# Patient Record
Sex: Female | Born: 1935 | Race: White | Hispanic: No | Smoking: Never smoker
Health system: Southern US, Community
[De-identification: ages and names within clinical notes are randomized; demographics above are authoritative.]

## PROBLEM LIST (undated history)

## (undated) DIAGNOSIS — C4492 Squamous cell carcinoma of skin, unspecified: Secondary | ICD-10-CM

## (undated) DIAGNOSIS — C801 Malignant (primary) neoplasm, unspecified: Secondary | ICD-10-CM

## (undated) DIAGNOSIS — H409 Unspecified glaucoma: Secondary | ICD-10-CM

## (undated) DIAGNOSIS — R7989 Other specified abnormal findings of blood chemistry: Secondary | ICD-10-CM

## (undated) DIAGNOSIS — Z9089 Acquired absence of other organs: Secondary | ICD-10-CM

## (undated) DIAGNOSIS — Z9889 Other specified postprocedural states: Secondary | ICD-10-CM

## (undated) DIAGNOSIS — B029 Zoster without complications: Secondary | ICD-10-CM

## (undated) DIAGNOSIS — F32A Depression, unspecified: Secondary | ICD-10-CM

## (undated) DIAGNOSIS — I10 Essential (primary) hypertension: Secondary | ICD-10-CM

## (undated) DIAGNOSIS — I6529 Occlusion and stenosis of unspecified carotid artery: Secondary | ICD-10-CM

## (undated) DIAGNOSIS — F329 Major depressive disorder, single episode, unspecified: Secondary | ICD-10-CM

## (undated) DIAGNOSIS — R112 Nausea with vomiting, unspecified: Secondary | ICD-10-CM

## (undated) DIAGNOSIS — E785 Hyperlipidemia, unspecified: Secondary | ICD-10-CM

## (undated) DIAGNOSIS — F419 Anxiety disorder, unspecified: Secondary | ICD-10-CM

## (undated) DIAGNOSIS — M1990 Unspecified osteoarthritis, unspecified site: Secondary | ICD-10-CM

## (undated) DIAGNOSIS — N6019 Diffuse cystic mastopathy of unspecified breast: Secondary | ICD-10-CM

## (undated) DIAGNOSIS — K5792 Diverticulitis of intestine, part unspecified, without perforation or abscess without bleeding: Secondary | ICD-10-CM

## (undated) DIAGNOSIS — R945 Abnormal results of liver function studies: Secondary | ICD-10-CM

## (undated) HISTORY — PX: CAROTID ENDARTERECTOMY: SUR193

## (undated) HISTORY — DX: Abnormal results of liver function studies: R94.5

## (undated) HISTORY — DX: Depression, unspecified: F32.A

## (undated) HISTORY — DX: Unspecified glaucoma: H40.9

## (undated) HISTORY — DX: Zoster without complications: B02.9

## (undated) HISTORY — DX: Acquired absence of other organs: Z90.89

## (undated) HISTORY — DX: Diverticulitis of intestine, part unspecified, without perforation or abscess without bleeding: K57.92

## (undated) HISTORY — DX: Hyperlipidemia, unspecified: E78.5

## (undated) HISTORY — PX: EYE SURGERY: SHX253

## (undated) HISTORY — DX: Essential (primary) hypertension: I10

## (undated) HISTORY — DX: Malignant (primary) neoplasm, unspecified: C80.1

## (undated) HISTORY — DX: Anxiety disorder, unspecified: F41.9

## (undated) HISTORY — DX: Diffuse cystic mastopathy of unspecified breast: N60.19

## (undated) HISTORY — DX: Major depressive disorder, single episode, unspecified: F32.9

## (undated) HISTORY — DX: Other specified abnormal findings of blood chemistry: R79.89

## (undated) HISTORY — PX: TONSILLECTOMY AND ADENOIDECTOMY: SUR1326

## (undated) HISTORY — DX: Occlusion and stenosis of unspecified carotid artery: I65.29

## (undated) HISTORY — PX: ESOPHAGOGASTRODUODENOSCOPY: SHX1529

## (undated) HISTORY — DX: Unspecified osteoarthritis, unspecified site: M19.90

## (undated) HISTORY — PX: TOE SURGERY: SHX1073

---
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
## 1970-12-14 DIAGNOSIS — C801 Malignant (primary) neoplasm, unspecified: Secondary | ICD-10-CM

## 1970-12-14 HISTORY — DX: Malignant (primary) neoplasm, unspecified: C80.1

## 1973-12-14 DIAGNOSIS — Z9089 Acquired absence of other organs: Secondary | ICD-10-CM

## 1973-12-14 HISTORY — DX: Acquired absence of other organs: Z90.89

## 1979-08-15 HISTORY — PX: ABDOMINAL HYSTERECTOMY: SHX81

## 2003-01-01 ENCOUNTER — Encounter: Payer: Self-pay | Admitting: Neurosurgery

## 2003-01-01 ENCOUNTER — Encounter: Admission: RE | Admit: 2003-01-01 | Discharge: 2003-01-01 | Payer: Self-pay | Admitting: Neurosurgery

## 2003-01-15 ENCOUNTER — Encounter: Admission: RE | Admit: 2003-01-15 | Discharge: 2003-01-15 | Payer: Self-pay | Admitting: Neurosurgery

## 2003-01-15 ENCOUNTER — Encounter: Payer: Self-pay | Admitting: Neurosurgery

## 2003-01-29 ENCOUNTER — Encounter: Admission: RE | Admit: 2003-01-29 | Discharge: 2003-01-29 | Payer: Self-pay | Admitting: Neurosurgery

## 2003-01-29 ENCOUNTER — Encounter: Payer: Self-pay | Admitting: Neurosurgery

## 2003-02-14 ENCOUNTER — Encounter: Payer: Self-pay | Admitting: Family Medicine

## 2003-02-14 ENCOUNTER — Encounter: Admission: RE | Admit: 2003-02-14 | Discharge: 2003-02-14 | Payer: Self-pay | Admitting: Family Medicine

## 2003-02-19 ENCOUNTER — Encounter: Payer: Self-pay | Admitting: Family Medicine

## 2003-02-19 ENCOUNTER — Encounter: Admission: RE | Admit: 2003-02-19 | Discharge: 2003-02-19 | Payer: Self-pay | Admitting: Family Medicine

## 2003-03-08 ENCOUNTER — Encounter: Payer: Self-pay | Admitting: Family Medicine

## 2003-03-08 ENCOUNTER — Encounter: Admission: RE | Admit: 2003-03-08 | Discharge: 2003-03-08 | Payer: Self-pay | Admitting: Family Medicine

## 2006-01-12 DIAGNOSIS — C4492 Squamous cell carcinoma of skin, unspecified: Secondary | ICD-10-CM

## 2006-01-12 HISTORY — DX: Squamous cell carcinoma of skin, unspecified: C44.92

## 2006-12-14 HISTORY — PX: COLONOSCOPY: SHX174

## 2007-02-16 ENCOUNTER — Encounter: Admission: RE | Admit: 2007-02-16 | Discharge: 2007-02-16 | Payer: Self-pay | Admitting: *Deleted

## 2007-03-02 ENCOUNTER — Encounter: Admission: RE | Admit: 2007-03-02 | Discharge: 2007-03-02 | Payer: Self-pay | Admitting: Family Medicine

## 2008-12-14 DIAGNOSIS — B029 Zoster without complications: Secondary | ICD-10-CM

## 2008-12-14 HISTORY — DX: Zoster without complications: B02.9

## 2009-01-10 ENCOUNTER — Emergency Department (HOSPITAL_COMMUNITY): Admission: EM | Admit: 2009-01-10 | Discharge: 2009-01-10 | Payer: Self-pay | Admitting: Emergency Medicine

## 2009-02-05 ENCOUNTER — Ambulatory Visit: Admission: RE | Admit: 2009-02-05 | Discharge: 2009-02-05 | Payer: Self-pay | Admitting: *Deleted

## 2009-07-11 ENCOUNTER — Encounter: Admission: RE | Admit: 2009-07-11 | Discharge: 2009-07-11 | Payer: Self-pay | Admitting: Neurosurgery

## 2010-08-02 ENCOUNTER — Emergency Department (HOSPITAL_COMMUNITY): Admission: EM | Admit: 2010-08-02 | Discharge: 2010-08-02 | Payer: Self-pay | Admitting: Emergency Medicine

## 2011-03-30 LAB — COMPREHENSIVE METABOLIC PANEL
ALT: 65 U/L — ABNORMAL HIGH (ref 0–35)
Creatinine, Ser: 0.97 mg/dL (ref 0.4–1.2)
Potassium: 3.7 mEq/L (ref 3.5–5.1)
Total Protein: 6.7 g/dL (ref 6.0–8.3)

## 2011-03-30 LAB — DIFFERENTIAL
Eosinophils Absolute: 0.3 10*3/uL (ref 0.0–0.7)
Eosinophils Relative: 4 % (ref 0–5)
Lymphocytes Relative: 25 % (ref 12–46)
Lymphs Abs: 1.8 10*3/uL (ref 0.7–4.0)
Monocytes Absolute: 0.6 10*3/uL (ref 0.1–1.0)
Neutro Abs: 4.4 10*3/uL (ref 1.7–7.7)
Neutrophils Relative %: 61 % (ref 43–77)

## 2011-03-30 LAB — CBC
Hemoglobin: 15.3 g/dL — ABNORMAL HIGH (ref 12.0–15.0)
MCHC: 33.5 g/dL (ref 30.0–36.0)
WBC: 7.2 10*3/uL (ref 4.0–10.5)

## 2011-03-30 LAB — D-DIMER, QUANTITATIVE: D-Dimer, Quant: 0.68 ug/mL-FEU — ABNORMAL HIGH (ref 0.00–0.48)

## 2011-03-30 LAB — BRAIN NATRIURETIC PEPTIDE: Pro B Natriuretic peptide (BNP): 168 pg/mL — ABNORMAL HIGH (ref 0.0–100.0)

## 2011-03-30 LAB — POCT CARDIAC MARKERS: CKMB, poc: 3.3 ng/mL (ref 1.0–8.0)

## 2012-11-01 ENCOUNTER — Other Ambulatory Visit: Payer: Self-pay | Admitting: Internal Medicine

## 2012-11-01 DIAGNOSIS — R0989 Other specified symptoms and signs involving the circulatory and respiratory systems: Secondary | ICD-10-CM

## 2012-11-07 ENCOUNTER — Ambulatory Visit
Admission: RE | Admit: 2012-11-07 | Discharge: 2012-11-07 | Disposition: A | Discharge status: Home / Self Care | Payer: Medicare Other | Source: Ambulatory Visit | Attending: Internal Medicine | Admitting: Internal Medicine

## 2012-11-07 DIAGNOSIS — R0989 Other specified symptoms and signs involving the circulatory and respiratory systems: Secondary | ICD-10-CM

## 2012-11-16 ENCOUNTER — Other Ambulatory Visit: Payer: Self-pay | Admitting: *Deleted

## 2012-11-16 DIAGNOSIS — I6529 Occlusion and stenosis of unspecified carotid artery: Secondary | ICD-10-CM

## 2012-11-25 ENCOUNTER — Encounter: Payer: Self-pay | Admitting: Surgery

## 2012-11-30 ENCOUNTER — Other Ambulatory Visit: Payer: Self-pay | Admitting: *Deleted

## 2012-11-30 DIAGNOSIS — I6529 Occlusion and stenosis of unspecified carotid artery: Secondary | ICD-10-CM

## 2012-12-02 ENCOUNTER — Encounter: Payer: Self-pay | Admitting: Surgery

## 2012-12-05 ENCOUNTER — Ambulatory Visit (INDEPENDENT_AMBULATORY_CARE_PROVIDER_SITE_OTHER): Payer: Medicare Other | Admitting: Surgery

## 2012-12-05 ENCOUNTER — Other Ambulatory Visit (INDEPENDENT_AMBULATORY_CARE_PROVIDER_SITE_OTHER): Payer: Medicare Other | Admitting: *Deleted

## 2012-12-05 ENCOUNTER — Encounter: Payer: Self-pay | Admitting: Surgery

## 2012-12-05 VITALS — BP 140/71 | HR 89 | Resp 18 | Ht 63.0 in | Wt 155.0 lb

## 2012-12-05 DIAGNOSIS — I6529 Occlusion and stenosis of unspecified carotid artery: Secondary | ICD-10-CM

## 2012-12-05 NOTE — Progress Notes (Signed)
Vascular and Vein Specialist of [facility]   Patient name: Michaela Flynn MRN: [id number] DOB: [birth year] Sex: female   Referred by: Dr. Selena Batten  Reason for referral:  Chief Complaint  Patient presents with  . New Evaluation    right carotid stenosis    HISTORY OF PRESENT ILLNESS: This is a very pleasant 78 year old female that is referred today for carotid stenosis. It is difficult to ascertain whether or not she is symptomatic. She has had a fall within the past 3 months where she missed the bed when sitting down. She has had some episodes of slurred speech. She has seen an ophthalmologist that thought she had opted nerve atherosclerotic changes. She has been managed with an aspirin. She denies weakness in either extremity.  She does not have classic symptoms of amaurosis fugax.  The patient does have hypercholesterolemia but is not on a statin because of elevated liver enzymes in the past. She is medically managed for her hypertension. She denies any significant cardiac history. She has had a echo in the past to evaluate for any pericardial effusion. This was negative.  Past Medical History  Diagnosis Date  . Hyperlipidemia   . Hypertension   . Anxiety and depression   . Glaucoma   . Abnormal LFTs     secondary to cholesterol  medication  . Fibrocystic breast disease   . Diverticulitis   . Shingles 2010  . S/P tonsillectomy and adenoidectomy 1975  . Cancer 1972    Nonmelanoma resection  . Arthritis     Lower back," Osteo"  . Carotid artery occlusion     Bruit    Past Surgical History  Procedure Date  . Cholecystectomy 1956  . Eye surgery     Cataract  . Appendectomy 1956  . Colonoscopy 2008    [facility] [place]  . Esophagogastroduodenoscopy     ?  Zenker's per the patient  . Toe surgery     Onychomycosis    History   Social History  . Marital Status: Widowed    Spouse Name: N/A    Number of Children: N/A  . Years of Education: N/A   Occupational History  .  Not on file.   Social History Main Topics  . Smoking status: Never Smoker   . Smokeless tobacco: Never Used  . Alcohol Use: No  . Drug Use: No  . Sexually Active: Not on file   Other Topics Concern  . Not on file   Social History Narrative  . No narrative on file    Family History  Problem Relation Age of Onset  . Stroke Father   . Heart attack Father     Allergies as of 12/05/2012  . (No Known Allergies)    Current Outpatient Prescriptions on File Prior to Visit  Medication Sig Dispense Refill  . aspirin 325 MG tablet Take 325 mg by mouth daily.      . benazepril-hydrochlorthiazide (LOTENSIN HCT) 20-12.5 MG per tablet Take 1 tablet by mouth daily.      . clonazePAM (KLONOPIN) 0.5 MG tablet Take 0.5 mg by mouth 2 (two) times daily as needed.      Marland Kitchen ESCITALOPRAM OXALATE PO Take 10 mg by mouth daily.      . metoprolol succinate (TOPROL-XL) 25 MG 24 hr tablet Take 25 mg by mouth daily.      Bertram Gala Glycol-Propyl Glycol (SYSTANE PRESERVATIVE FREE) 0.4-0.3 % SOLN Apply to eye.      . traMADol (ULTRAM) 50  MG tablet Take 50 mg by mouth every 6 (six) hours as needed.      . travoprost, benzalkonium, (TRAVATAN) 0.004 % ophthalmic solution 1 drop at bedtime.      Marland Kitchen zolpidem (AMBIEN) 5 MG tablet Take 5 mg by mouth at bedtime as needed.         REVIEW OF SYSTEMS: Cardiovascular: No chest pain, chest pressure, palpitations, orthopnea, or dyspnea on exertion. No claudication or rest pain,  No history of DVT or phlebitis. Pulmonary: No productive cough, asthma or wheezing. Neurologic: No weakness, paresthesias, aphasia, or amaurosis. No dizziness. Poor hearing Hematologic: No bleeding problems or clotting disorders. Musculoskeletal: No joint pain or joint swelling. Gastrointestinal: No blood in stool or hematemesis Genitourinary: No dysuria or hematuria. Psychiatric:: No history of major depression. Integumentary: No rashes or ulcers. Constitutional: No fever or  chills.  PHYSICAL EXAMINATION: General: The patient appears their stated age.  Vital signs are BP 140/71  Pulse 89  Resp 18  Ht 5\' 3"  (1.6 m)  Wt 155 lb (70.308 kg)  BMI 27.46 kg/m2 HEENT:  No gross abnormalities Pulmonary: Respirations are non-labored Abdomen: Soft and non-tender  Musculoskeletal: There are no major deformities.   Neurologic: No focal weakness or paresthesias are detected, Skin: There are no ulcer or rashes noted. Psychiatric: The patient has normal affect. Cardiovascular: There is a regular rate and rhythm without significant murmur appreciated. I could not hear a carotid bruit today pedal pulses were not palpable. There was a fair amount of edema in both ankles.  Diagnostic Studies: Outside carotid imaging reveals greater than 70% right carotid stenosis and less than 50% left carotid stenosis. The imaging was repeated in our office today which shows 80-99% stenosis on the right. In that diastolic velocities were to 35. The bifurcation was noted to be mid to high  Outside Studies/Documentation Historical records were reviewed.  They showed right carotid stenosis  Medication Changes: None  Assessment:  Possible symptomatic right carotid stenosis Plan: It is difficult to tell by the patient's history whether or not she has had symptomatology from her right carotid stenosis. She is neurologically intact today. Based on the degree of stenosis we have decided to proceed with intervention. Because of her age, I am reluctant to proceed with carotid stenting. We discussed the risks and benefits of carotid endarterectomy, which include the risk of stroke, the risk of nerve injury, and the risk of cardiopulmonary complications. Prior to proceeding I am going to obtain a Myoview to evaluate her heart. 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 She will bring a living Will with her however in our discussion, she is accepting of blood substitutes as well as Cell Saver blood.  She states that she would rather die than receive a blood transfusion. I have scheduled her for a right carotid endarterectomy on Friday, January 10     V. Charlena Cross, M.D. V[facility] and Vein Specialists of [place] Office: [phone] Pager:  [phone]

## 2012-12-06 ENCOUNTER — Other Ambulatory Visit: Payer: Self-pay | Admitting: *Deleted

## 2012-12-06 ENCOUNTER — Other Ambulatory Visit (HOSPITAL_COMMUNITY): Payer: Self-pay | Admitting: Surgery

## 2012-12-06 ENCOUNTER — Encounter: Payer: Self-pay | Admitting: *Deleted

## 2012-12-06 ENCOUNTER — Telehealth: Payer: Self-pay | Admitting: Surgery

## 2012-12-06 DIAGNOSIS — I6529 Occlusion and stenosis of unspecified carotid artery: Secondary | ICD-10-CM

## 2012-12-06 NOTE — Telephone Encounter (Signed)
Patient is scheduled for Carotid Endarterectomy for January 10th, according to blue dc 12/05/12- pt will need Myoview @ [facility]. Pts daughter Michaela Flynn asked that we schedule this on a Monday due to her schedule.  According to [facility], Stress Tests are not performed on Mondays.  Myoview is scheduled for Thursday Jan 2nd 2013 @ 1:45pm.  I spoke with Michaela Flynn to notify, and she was concerned that the appointment was not on a Monday and stated she would "have to talk to her brother." I advised that if the appointment needed to be rescheduled, she could call [facility] directly to reschedule in order to accommodate her schedule. I also explained that we would need to have the results of the Myoview a day prior to her 12/24/11 surgery. dpm

## 2012-12-12 ENCOUNTER — Other Ambulatory Visit: Payer: Self-pay

## 2012-12-15 ENCOUNTER — Encounter (HOSPITAL_COMMUNITY): Payer: Self-pay | Admitting: Respiratory Therapy

## 2012-12-15 ENCOUNTER — Ambulatory Visit (HOSPITAL_COMMUNITY)
Admission: RE | Admit: 2012-12-15 | Discharge: 2012-12-15 | Disposition: A | Discharge status: Home / Self Care | Payer: Medicare Other | Source: Ambulatory Visit | Attending: Cardiovascular Disease | Admitting: Cardiovascular Disease

## 2012-12-15 DIAGNOSIS — I6529 Occlusion and stenosis of unspecified carotid artery: Secondary | ICD-10-CM | POA: Insufficient documentation

## 2012-12-15 MED ORDER — REGADENOSON 0.4 MG/5ML IV SOLN
0.4000 mg | Freq: Once | INTRAVENOUS | Status: AC
  Administered 2012-12-15: 0.4 mg via INTRAVENOUS

## 2012-12-15 MED ORDER — AMINOPHYLLINE 25 MG/ML IV SOLN
75.0000 mg | Freq: Once | INTRAVENOUS | Status: AC
  Administered 2012-12-15: 75 mg via INTRAVENOUS

## 2012-12-15 MED ORDER — TECHNETIUM TC 99M SESTAMIBI GENERIC - CARDIOLITE
30.8000 | Freq: Once | INTRAVENOUS | Status: AC | PRN
  Administered 2012-12-15: 30.8 via INTRAVENOUS

## 2012-12-15 MED ORDER — TECHNETIUM TC 99M SESTAMIBI GENERIC - CARDIOLITE
11.0000 | Freq: Once | INTRAVENOUS | Status: AC | PRN
  Administered 2012-12-15: 11 via INTRAVENOUS

## 2012-12-15 NOTE — Procedures (Addendum)
[facility] [address] [place] [address] [phone]  Cardiology Nuclear Med Study  Michaela Flynn is a 79 y.o. female     MRN : [id number]     DOB: 1925/05/22  Procedure Date: 12/15/2012  Nuclear Med Background Indication for Stress Test:  Surgical Clearance History:  no prior cardiac history Cardiac Risk Factors: Family History - CAD, Hypertension, Lipids and Overweight  Symptoms:  Dizziness   Nuclear Pre-Procedure Caffeine/Decaff Intake:  1:00am NPO After: 11:00am   IV Site: R Antecubital  IV 0.9% NS with Angio Cath:  22g  Chest Size (in):  n/a IV Started by: Koren Shiver, CNMT  Height: 5\' 3"  (1.6 m)  Cup Size: C  BMI:  Body mass index is 27.46 kg/(m^2). Weight:  155 lb (70.308 kg)   Tech Comments:  n/a    Nuclear Med Study 1 or 2 day study: 1 day  Stress Test Type:  Lexiscan  Order Authorizing Provider:  Janae Bridgeman, MD   Resting Radionuclide: Technetium 78m Sestamibi  Resting Radionuclide Dose: 11.0 mCi   Stress Radionuclide:  Technetium 74m Sestamibi  Stress Radionuclide Dose: 30.8 mCi           Stress Protocol Rest HR: 54 Stress HR: 74  Rest BP: 168/79 Stress BP: 173/72  Exercise Time (min): n/a METS: n/a          Dose of Adenosine (mg):  n/a Dose of Lexiscan: 0.4 mg  Dose of Atropine (mg): n/a Dose of Dobutamine: n/a mcg/kg/min (at max HR)  Stress Test Technologist: Ernestene Mention, CCT Nuclear Technologist: Gonzella Lex, CNMT   Rest Procedure:  Myocardial perfusion imaging was performed at rest 45 minutes following the intravenous administration of Technetium 53m Sestamibi. Stress Procedure:  The patient received IV Lexiscan 0.4 mg over 15-seconds.  Technetium 62m Sestamibi injected at 30-seconds. Patient was given 75 mg of aminophylline through her IV due to her shortness of breath. Symptoms were resolved. There were no significant changes with Lexiscan.  Quantitative spect images were obtained  after a 45 minute delay.  Transient Ischemic Dilatation (Normal <1.22): 1.10 Lung/Heart Ratio (Normal <0.45):  0.32 QGS EDV:  45 ml QGS ESV:  8 ml LV Ejection Fraction: 82%  Signed by      Rest ECG: NSR with poor R wave progression and T wave inversion in avL.  Stress ECG: No significant change from baseline ECG  QPS Raw Data Images:  Normal; no motion artifact; normal heart/lung ratio. Stress Images:  Normal homogeneous uptake in all areas of the myocardium. Rest Images:  There is mild apical thinning with normal uptake in other regions. Subtraction (SDS):  No evidence of ischemia.  Impression Exercise Capacity:  Lexiscan with no exercise. BP Response:  Normal blood pressure response. Clinical Symptoms:  No significant symptoms noted. ECG Impression:  No significant ST segment change suggestive of ischemia. Comparison with Prior Nuclear Study: No images to compare  Overall Impression:  Normal stress nuclear study. Low risk stress nuclear study.  LV Wall Motion:  NL LV Function; NL Wall Motion   Lennette Bihari, MD  12/15/2012 5:06 PM

## 2012-12-20 ENCOUNTER — Encounter (HOSPITAL_COMMUNITY): Payer: Self-pay

## 2012-12-20 ENCOUNTER — Encounter (HOSPITAL_COMMUNITY)
Admission: RE | Admit: 2012-12-20 | Discharge: 2012-12-20 | Disposition: A | Discharge status: Home / Self Care | Payer: Medicare Other | Source: Ambulatory Visit | Attending: Surgery | Admitting: Surgery

## 2012-12-20 ENCOUNTER — Encounter (HOSPITAL_COMMUNITY)
Admission: RE | Admit: 2012-12-20 | Discharge: 2012-12-20 | Disposition: A | Discharge status: Home / Self Care | Payer: Medicare Other | Source: Ambulatory Visit | Attending: Anesthesiology | Admitting: Anesthesiology

## 2012-12-20 HISTORY — DX: Nausea with vomiting, unspecified: R11.2

## 2012-12-20 HISTORY — DX: Other specified postprocedural states: Z98.890

## 2012-12-20 LAB — COMPREHENSIVE METABOLIC PANEL
ALT: 13 U/L (ref 0–35)
AST: 17 U/L (ref 0–37)
CO2: 29 mEq/L (ref 19–32)
Chloride: 101 mEq/L (ref 96–112)
Creatinine, Ser: 0.97 mg/dL (ref 0.50–1.10)
GFR calc non Af Amer: 52 mL/min — ABNORMAL LOW (ref >90)
Glucose, Bld: 99 mg/dL (ref 70–99)
Sodium: 140 mEq/L (ref 135–145)
Total Bilirubin: 0.5 mg/dL (ref 0.3–1.2)

## 2012-12-20 LAB — CBC
Hemoglobin: 16.1 g/dL — ABNORMAL HIGH (ref 12.0–15.0)
MCV: 88.5 fL (ref 78.0–100.0)
Platelets: 232 10*3/uL (ref 150–400)
RBC: 5.22 MIL/uL — ABNORMAL HIGH (ref 3.87–5.11)
WBC: 7.6 10*3/uL (ref 4.0–10.5)

## 2012-12-20 LAB — URINE MICROSCOPIC-ADD ON

## 2012-12-20 LAB — PROTIME-INR: Prothrombin Time: 12.8 seconds (ref 11.6–15.2)

## 2012-12-20 LAB — URINALYSIS, ROUTINE W REFLEX MICROSCOPIC
Bilirubin Urine: NEGATIVE
Protein, ur: NEGATIVE mg/dL
Urobilinogen, UA: 0.2 mg/dL (ref 0.0–1.0)

## 2012-12-20 NOTE — Pre-Procedure Instructions (Signed)
20 Michaela Flynn  12/20/2012   Your procedure is scheduled on:  January 10  Report to [facility] at 07:30 AM.  Call this number if you have problems the morning of surgery: [phone]   Remember:   Do not eat food or drink:After Midnight.  Take these medicines the morning of surgery with A SIP OF WATER: Klonopin, Lexapro, Metoprolol, Tramadol (if needed), eye drops,   Do not wear jewelry, make-up or nail polish.  Do not wear lotions, powders, or perfumes. You may wear deodorant.  Do not shave 48 hours prior to surgery. Men may shave face and neck.  Do not bring valuables to the hospital.  Contacts, dentures or bridgework may not be worn into surgery.  Leave suitcase in the car. After surgery it may be brought to your room.  For patients admitted to the hospital, checkout time is 11:00 AM the day of discharge.   Special Instructions: Shower using CHG 2 nights before surgery and the night before surgery.  If you shower the day of surgery use CHG.  Use special wash - you have one bottle of CHG for all showers.  You should use approximately 1/3 of the bottle for each shower.   Please read over the following fact sheets that you were given: Pain Booklet, Coughing and Deep Breathing and Surgical Site Infection Prevention

## 2012-12-20 NOTE — Progress Notes (Signed)
Will send chart to anesthesia for review of EKG. Patient will sign blood product refusal DOS after she determines if she can accept albumin.

## 2012-12-21 NOTE — Consult Note (Signed)
Anesthesia Chart Review:  Patient is a 79 year old female scheduled for right CEA by Dr. Myra Gianotti on 12/23/12.  History includes non-smoker, post-operative N/V, HTN, HLD, glaucoma, anxiety, depression, diverticulitis, skin cancer. PCP is Dr. Pearson Grippe. She is a Scientist, product/process development.  She has refused RBCs, but will discuss consideration of other blood products with her surgeon on the day of surgery.  Carotid duplex on 12/05/12 showed 80-99% stenosis on the right. In that diastolic velocities were to 35. The bifurcation was noted to be mid to high.  Less than 50% on the left.  EKG on showed SB, LAD, minimal voltage criteria for LVH, septal infarct (age undetermined).  Nuclear stress test on 12/15/12 showed: Normal stress nuclear study. Low risk stress nuclear study.  LV Wall Motion: NL LV Function; NL Wall Motion.  EF 82%.  CXR on 12/20/12 showed no acute cardiopulmonary abnormality.  Preoperative labs noted.  H/H 16.1/46.2.  LFTs WNL.  Urine culture showed > 100,000 E. Coli.  Results routed to Dr. Myra Gianotti and [facility] nurses Darel Hong and Okey Regal.  Defer treatment recommendations for UTI to Dr. Myra Gianotti.  Anticipate she can proceed from an anesthesia standpoint.  Shonna Chock, PA-C 12/21/12 1231

## 2012-12-22 ENCOUNTER — Other Ambulatory Visit: Payer: Self-pay

## 2012-12-22 LAB — URINE CULTURE

## 2012-12-22 MED ORDER — DEXTROSE 5 % IV SOLN
1.5000 g | INTRAVENOUS | Status: AC
  Administered 2012-12-23: 1.5 g via INTRAVENOUS
  Filled 2012-12-22: qty 1.5

## 2012-12-23 ENCOUNTER — Encounter (HOSPITAL_COMMUNITY): Payer: Self-pay | Admitting: Vascular Surgery

## 2012-12-23 ENCOUNTER — Inpatient Hospital Stay (HOSPITAL_COMMUNITY)
Admission: RE | Admit: 2012-12-23 | Discharge: 2012-12-24 | DRG: 038 | Disposition: A | Discharge status: Home / Self Care | Payer: Medicare Other | Source: Ambulatory Visit | Attending: Surgery | Admitting: Surgery

## 2012-12-23 ENCOUNTER — Telehealth: Payer: Self-pay | Admitting: Surgery

## 2012-12-23 ENCOUNTER — Encounter (HOSPITAL_COMMUNITY)
Admission: RE | Disposition: A | Discharge status: Home / Self Care | Payer: Self-pay | Source: Ambulatory Visit | Attending: Surgery

## 2012-12-23 ENCOUNTER — Ambulatory Visit (HOSPITAL_COMMUNITY): Payer: Medicare Other | Admitting: Vascular Surgery

## 2012-12-23 DIAGNOSIS — I10 Essential (primary) hypertension: Secondary | ICD-10-CM | POA: Diagnosis present

## 2012-12-23 DIAGNOSIS — Z7982 Long term (current) use of aspirin: Secondary | ICD-10-CM

## 2012-12-23 DIAGNOSIS — I6529 Occlusion and stenosis of unspecified carotid artery: Principal | ICD-10-CM | POA: Diagnosis present

## 2012-12-23 DIAGNOSIS — F3289 Other specified depressive episodes: Secondary | ICD-10-CM | POA: Diagnosis present

## 2012-12-23 DIAGNOSIS — Z01818 Encounter for other preprocedural examination: Secondary | ICD-10-CM

## 2012-12-23 DIAGNOSIS — Z01812 Encounter for preprocedural laboratory examination: Secondary | ICD-10-CM

## 2012-12-23 DIAGNOSIS — Z01810 Encounter for preprocedural cardiovascular examination: Secondary | ICD-10-CM

## 2012-12-23 DIAGNOSIS — F411 Generalized anxiety disorder: Secondary | ICD-10-CM | POA: Diagnosis present

## 2012-12-23 DIAGNOSIS — H409 Unspecified glaucoma: Secondary | ICD-10-CM | POA: Diagnosis present

## 2012-12-23 DIAGNOSIS — N390 Urinary tract infection, site not specified: Secondary | ICD-10-CM | POA: Diagnosis present

## 2012-12-23 DIAGNOSIS — F329 Major depressive disorder, single episode, unspecified: Secondary | ICD-10-CM | POA: Diagnosis present

## 2012-12-23 DIAGNOSIS — E785 Hyperlipidemia, unspecified: Secondary | ICD-10-CM | POA: Diagnosis present

## 2012-12-23 DIAGNOSIS — Z79899 Other long term (current) drug therapy: Secondary | ICD-10-CM

## 2012-12-23 HISTORY — PX: PATCH ANGIOPLASTY: SHX6230

## 2012-12-23 HISTORY — PX: ENDARTERECTOMY: SHX5162

## 2012-12-23 LAB — URINE MICROSCOPIC-ADD ON

## 2012-12-23 LAB — URINALYSIS, ROUTINE W REFLEX MICROSCOPIC
Glucose, UA: NEGATIVE mg/dL
Ketones, ur: 15 mg/dL — AB
Specific Gravity, Urine: 1.027 (ref 1.005–1.030)
pH: 5 (ref 5.0–8.0)

## 2012-12-23 SURGERY — ENDARTERECTOMY, CAROTID
Anesthesia: General | Site: Neck | Laterality: Right | Wound class: Clean

## 2012-12-23 MED ORDER — ACETAMINOPHEN 650 MG RE SUPP: 325.0000 mg | RECTAL | Status: DC | PRN

## 2012-12-23 MED ORDER — LIDOCAINE HCL (PF) 1 % IJ SOLN
INTRAMUSCULAR | Status: AC
  Filled 2012-12-23: qty 30

## 2012-12-23 MED ORDER — MAGNESIUM SULFATE 40 MG/ML IJ SOLN
2.0000 g | Freq: Once | INTRAMUSCULAR | Status: AC | PRN
  Filled 2012-12-23: qty 50

## 2012-12-23 MED ORDER — CIPROFLOXACIN IN D5W 400 MG/200ML IV SOLN
400.0000 mg | Freq: Two times a day (BID) | INTRAVENOUS | Status: DC
  Filled 2012-12-23: qty 200

## 2012-12-23 MED ORDER — FENTANYL CITRATE 0.05 MG/ML IJ SOLN
INTRAMUSCULAR | Status: AC
  Filled 2012-12-23: qty 2

## 2012-12-23 MED ORDER — SODIUM CHLORIDE 0.9 % IV SOLN
INTRAVENOUS | Status: DC
  Administered 2012-12-23 (×2): via INTRAVENOUS

## 2012-12-23 MED ORDER — SODIUM CHLORIDE 0.9 % IV SOLN: 500.0000 mL | Freq: Once | INTRAVENOUS | Status: AC | PRN

## 2012-12-23 MED ORDER — LIDOCAINE HCL (CARDIAC) 20 MG/ML IV SOLN
INTRAVENOUS | Status: DC | PRN
  Administered 2012-12-23: 20 mg via INTRAVENOUS

## 2012-12-23 MED ORDER — NEOSTIGMINE METHYLSULFATE 1 MG/ML IJ SOLN
INTRAMUSCULAR | Status: DC | PRN
  Administered 2012-12-23: 3 mg via INTRAVENOUS

## 2012-12-23 MED ORDER — TRAVOPROST 0.004 % OP SOLN
1.0000 [drp] | Freq: Every day | OPHTHALMIC | Status: DC
  Administered 2012-12-23: 1 [drp] via OPHTHALMIC
  Filled 2012-12-23 (×4): qty 0.1

## 2012-12-23 MED ORDER — CIPROFLOXACIN HCL 500 MG PO TABS: 500.0000 mg | ORAL_TABLET | Freq: Two times a day (BID) | ORAL | Status: DC

## 2012-12-23 MED ORDER — ESCITALOPRAM OXALATE 5 MG PO TABS
5.0000 mg | ORAL_TABLET | Freq: Every day | ORAL | Status: DC
  Administered 2012-12-23 – 2012-12-24 (×2): 5 mg via ORAL
  Filled 2012-12-23 (×2): qty 1

## 2012-12-23 MED ORDER — MIDAZOLAM HCL 2 MG/2ML IJ SOLN: 0.5000 mg | Freq: Once | INTRAMUSCULAR | Status: DC | PRN

## 2012-12-23 MED ORDER — ONDANSETRON HCL 4 MG/2ML IJ SOLN: 4.0000 mg | Freq: Four times a day (QID) | INTRAMUSCULAR | Status: DC | PRN

## 2012-12-23 MED ORDER — CEFUROXIME SODIUM 1.5 G IJ SOLR
1.5000 g | Freq: Two times a day (BID) | INTRAMUSCULAR | Status: AC
  Administered 2012-12-23 – 2012-12-24 (×2): 1.5 g via INTRAVENOUS
  Filled 2012-12-23 (×2): qty 1.5

## 2012-12-23 MED ORDER — SODIUM CHLORIDE 0.9 % IR SOLN
Status: DC | PRN
  Administered 2012-12-23: 11:00:00

## 2012-12-23 MED ORDER — GUAIFENESIN-DM 100-10 MG/5ML PO SYRP: 15.0000 mL | ORAL_SOLUTION | ORAL | Status: DC | PRN

## 2012-12-23 MED ORDER — DOPAMINE-DEXTROSE 3.2-5 MG/ML-% IV SOLN: 3.0000 ug/kg/min | INTRAVENOUS | Status: DC

## 2012-12-23 MED ORDER — POTASSIUM CHLORIDE CRYS ER 20 MEQ PO TBCR: 20.0000 meq | EXTENDED_RELEASE_TABLET | Freq: Once | ORAL | Status: AC | PRN

## 2012-12-23 MED ORDER — POLYVINYL ALCOHOL 1.4 % OP SOLN
1.0000 [drp] | Freq: Every day | OPHTHALMIC | Status: DC | PRN
  Filled 2012-12-23: qty 15

## 2012-12-23 MED ORDER — TRAMADOL HCL 50 MG PO TABS: 50.0000 mg | ORAL_TABLET | Freq: Four times a day (QID) | ORAL | Status: DC | PRN

## 2012-12-23 MED ORDER — METOPROLOL TARTRATE 1 MG/ML IV SOLN: 2.0000 mg | INTRAVENOUS | Status: DC | PRN

## 2012-12-23 MED ORDER — DEXAMETHASONE SODIUM PHOSPHATE 4 MG/ML IJ SOLN
INTRAMUSCULAR | Status: DC | PRN
  Administered 2012-12-23: 4 mg via INTRAVENOUS

## 2012-12-23 MED ORDER — TRAMADOL HCL 50 MG PO TABS
50.0000 mg | ORAL_TABLET | Freq: Four times a day (QID) | ORAL | Status: DC
  Administered 2012-12-23 – 2012-12-24 (×3): 50 mg via ORAL
  Filled 2012-12-23 (×3): qty 1

## 2012-12-23 MED ORDER — ACETAMINOPHEN 325 MG PO TABS
325.0000 mg | ORAL_TABLET | ORAL | Status: DC | PRN
  Administered 2012-12-23: 650 mg via ORAL
  Filled 2012-12-23: qty 2

## 2012-12-23 MED ORDER — CIPROFLOXACIN IN D5W 400 MG/200ML IV SOLN
400.0000 mg | INTRAVENOUS | Status: AC
  Administered 2012-12-23: 400 mg via INTRAVENOUS
  Filled 2012-12-23: qty 200

## 2012-12-23 MED ORDER — BENAZEPRIL-HYDROCHLOROTHIAZIDE 20-12.5 MG PO TABS: 1.0000 | ORAL_TABLET | Freq: Every day | ORAL | Status: DC

## 2012-12-23 MED ORDER — DEXTRAN 40 IN SALINE 10-0.9 % IV SOLN
INTRAVENOUS | Status: AC
  Filled 2012-12-23: qty 500

## 2012-12-23 MED ORDER — MORPHINE SULFATE 2 MG/ML IJ SOLN
2.0000 mg | INTRAMUSCULAR | Status: DC | PRN
  Administered 2012-12-23: 2 mg via INTRAVENOUS
  Filled 2012-12-23: qty 1

## 2012-12-23 MED ORDER — PROMETHAZINE HCL 25 MG/ML IJ SOLN: 6.2500 mg | INTRAMUSCULAR | Status: DC | PRN

## 2012-12-23 MED ORDER — ONDANSETRON HCL 4 MG/2ML IJ SOLN
INTRAMUSCULAR | Status: DC | PRN
  Administered 2012-12-23: 4 mg via INTRAVENOUS

## 2012-12-23 MED ORDER — HYDROCHLOROTHIAZIDE 12.5 MG PO CAPS
12.5000 mg | ORAL_CAPSULE | Freq: Every day | ORAL | Status: DC
  Filled 2012-12-23 (×2): qty 1

## 2012-12-23 MED ORDER — LACTATED RINGERS IV SOLN
INTRAVENOUS | Status: DC | PRN
  Administered 2012-12-23 (×2): via INTRAVENOUS

## 2012-12-23 MED ORDER — LIDOCAINE HCL 4 % MT SOLN
OROMUCOSAL | Status: DC | PRN
  Administered 2012-12-23: 4 mL via TOPICAL

## 2012-12-23 MED ORDER — FENTANYL CITRATE 0.05 MG/ML IJ SOLN
25.0000 ug | INTRAMUSCULAR | Status: DC | PRN
  Administered 2012-12-23 (×2): 50 ug via INTRAVENOUS

## 2012-12-23 MED ORDER — CLONAZEPAM 0.5 MG PO TABS
0.5000 mg | ORAL_TABLET | Freq: Two times a day (BID) | ORAL | Status: DC | PRN
  Administered 2012-12-23: 0.5 mg via ORAL
  Filled 2012-12-23: qty 1

## 2012-12-23 MED ORDER — PANTOPRAZOLE SODIUM 40 MG PO TBEC
40.0000 mg | DELAYED_RELEASE_TABLET | Freq: Every day | ORAL | Status: DC
  Administered 2012-12-23 – 2012-12-24 (×2): 40 mg via ORAL
  Filled 2012-12-23: qty 1

## 2012-12-23 MED ORDER — METOPROLOL TARTRATE 25 MG PO TABS
25.0000 mg | ORAL_TABLET | Freq: Two times a day (BID) | ORAL | Status: DC
  Administered 2012-12-23: 25 mg via ORAL
  Filled 2012-12-23 (×4): qty 1

## 2012-12-23 MED ORDER — SENNOSIDES-DOCUSATE SODIUM 8.6-50 MG PO TABS
1.0000 | ORAL_TABLET | Freq: Every evening | ORAL | Status: DC | PRN
  Filled 2012-12-23: qty 1

## 2012-12-23 MED ORDER — FENTANYL CITRATE 0.05 MG/ML IJ SOLN
INTRAMUSCULAR | Status: DC | PRN
  Administered 2012-12-23 (×3): 50 ug via INTRAVENOUS
  Administered 2012-12-23: 100 ug via INTRAVENOUS

## 2012-12-23 MED ORDER — PHENOL 1.4 % MT LIQD
1.0000 | OROMUCOSAL | Status: DC | PRN
  Administered 2012-12-23: 1 via OROMUCOSAL
  Filled 2012-12-23: qty 177

## 2012-12-23 MED ORDER — BISACODYL 10 MG RE SUPP: 10.0000 mg | Freq: Every day | RECTAL | Status: DC | PRN

## 2012-12-23 MED ORDER — SODIUM CHLORIDE 0.9 % IV SOLN: INTRAVENOUS | Status: DC

## 2012-12-23 MED ORDER — HYDRALAZINE HCL 20 MG/ML IJ SOLN: 10.0000 mg | INTRAMUSCULAR | Status: DC | PRN

## 2012-12-23 MED ORDER — HEMOSTATIC AGENTS (NO CHARGE) OPTIME
TOPICAL | Status: DC | PRN
  Administered 2012-12-23: 1 via TOPICAL

## 2012-12-23 MED ORDER — BENAZEPRIL HCL 20 MG PO TABS
20.0000 mg | ORAL_TABLET | Freq: Every day | ORAL | Status: DC
  Filled 2012-12-23 (×2): qty 1

## 2012-12-23 MED ORDER — 0.9 % SODIUM CHLORIDE (POUR BTL) OPTIME
TOPICAL | Status: DC | PRN
  Administered 2012-12-23: 3000 mL

## 2012-12-23 MED ORDER — LABETALOL HCL 5 MG/ML IV SOLN
INTRAVENOUS | Status: DC | PRN
  Administered 2012-12-23: 5 mg via INTRAVENOUS

## 2012-12-23 MED ORDER — ASPIRIN EC 325 MG PO TBEC
325.0000 mg | DELAYED_RELEASE_TABLET | Freq: Every day | ORAL | Status: DC
  Administered 2012-12-23 – 2012-12-24 (×2): 325 mg via ORAL
  Filled 2012-12-23 (×2): qty 1

## 2012-12-23 MED ORDER — ARTIFICIAL TEARS OP OINT
TOPICAL_OINTMENT | OPHTHALMIC | Status: DC | PRN
  Administered 2012-12-23: 1 via OPHTHALMIC

## 2012-12-23 MED ORDER — PROPOFOL 10 MG/ML IV BOLUS
INTRAVENOUS | Status: DC | PRN
  Administered 2012-12-23: 80 mg via INTRAVENOUS

## 2012-12-23 MED ORDER — PHENYLEPHRINE HCL 10 MG/ML IJ SOLN
10.0000 mg | INTRAVENOUS | Status: DC | PRN
  Administered 2012-12-23: 50 ug/min via INTRAVENOUS

## 2012-12-23 MED ORDER — MEPERIDINE HCL 25 MG/ML IJ SOLN: 6.2500 mg | INTRAMUSCULAR | Status: DC | PRN

## 2012-12-23 MED ORDER — EPHEDRINE SULFATE 50 MG/ML IJ SOLN
INTRAMUSCULAR | Status: DC | PRN
  Administered 2012-12-23: 5 mg via INTRAVENOUS

## 2012-12-23 MED ORDER — OXYCODONE HCL 5 MG PO TABS: 5.0000 mg | ORAL_TABLET | Freq: Once | ORAL | Status: DC | PRN

## 2012-12-23 MED ORDER — OXYCODONE HCL 5 MG/5ML PO SOLN
5.0000 mg | Freq: Once | ORAL | Status: DC | PRN
Start: 2012-12-23 — End: 2012-12-23

## 2012-12-23 MED ORDER — DOCUSATE SODIUM 100 MG PO CAPS: 100.0000 mg | ORAL_CAPSULE | Freq: Every day | ORAL | Status: DC

## 2012-12-23 MED ORDER — GLYCOPYRROLATE 0.2 MG/ML IJ SOLN
INTRAMUSCULAR | Status: DC | PRN
  Administered 2012-12-23: 0.4 mg via INTRAVENOUS

## 2012-12-23 MED ORDER — ROCURONIUM BROMIDE 100 MG/10ML IV SOLN
INTRAVENOUS | Status: DC | PRN
  Administered 2012-12-23: 50 mg via INTRAVENOUS

## 2012-12-23 MED ORDER — HEPARIN SODIUM (PORCINE) 1000 UNIT/ML IJ SOLN
INTRAMUSCULAR | Status: DC | PRN
  Administered 2012-12-23: 1000 [IU] via INTRAVENOUS
  Administered 2012-12-23: 6000 [IU] via INTRAVENOUS

## 2012-12-23 MED ORDER — PROTAMINE SULFATE 10 MG/ML IV SOLN
INTRAVENOUS | Status: DC | PRN
  Administered 2012-12-23 (×5): 10 mg via INTRAVENOUS

## 2012-12-23 MED ORDER — LABETALOL HCL 5 MG/ML IV SOLN: 10.0000 mg | INTRAVENOUS | Status: DC | PRN

## 2012-12-23 MED ORDER — DEXTRAN 40 IN SALINE 10-0.9 % IV SOLN
INTRAVENOUS | Status: DC | PRN
  Administered 2012-12-23: 60 mL

## 2012-12-23 MED ORDER — OXYCODONE-ACETAMINOPHEN 5-325 MG PO TABS: 1.0000 | ORAL_TABLET | ORAL | Status: DC | PRN

## 2012-12-23 MED ORDER — ASPIRIN 325 MG PO TABS: 325.0000 mg | ORAL_TABLET | Freq: Every day | ORAL | Status: DC

## 2012-12-23 MED ORDER — ALUM & MAG HYDROXIDE-SIMETH 200-200-20 MG/5ML PO SUSP: 15.0000 mL | ORAL | Status: DC | PRN

## 2012-12-23 SURGICAL SUPPLY — 52 items
ADH SKN CLS APL DERMABOND .7 (GAUZE/BANDAGES/DRESSINGS) ×1
CANISTER SUCTION 2500CC (MISCELLANEOUS) ×2 IMPLANT
CATH ROBINSON RED A/P 18FR (CATHETERS) ×2 IMPLANT
CATH SUCT 10FR WHISTLE TIP (CATHETERS) ×2 IMPLANT
CLIP TI MEDIUM 24 (CLIP) ×2 IMPLANT
CLIP TI WIDE RED SMALL 24 (CLIP) ×2 IMPLANT
CLOTH BEACON ORANGE TIMEOUT ST (SAFETY) ×2 IMPLANT
CONT SPECI 4OZ STER CLIK (MISCELLANEOUS) ×1 IMPLANT
COVER SURGICAL LIGHT HANDLE (MISCELLANEOUS) ×2 IMPLANT
CRADLE DONUT ADULT HEAD (MISCELLANEOUS) ×2 IMPLANT
DERMABOND ADVANCED (GAUZE/BANDAGES/DRESSINGS) ×1
DERMABOND ADVANCED .7 DNX12 (GAUZE/BANDAGES/DRESSINGS) ×1 IMPLANT
DRAIN CHANNEL 15F RND FF W/TCR (WOUND CARE) IMPLANT
DRAPE WARM FLUID 44X44 (DRAPE) ×2 IMPLANT
ELECT REM PT RETURN 9FT ADLT (ELECTROSURGICAL) ×2
ELECTRODE REM PT RTRN 9FT ADLT (ELECTROSURGICAL) ×1 IMPLANT
EVACUATOR SILICONE 100CC (DRAIN) IMPLANT
GLOVE BIOGEL PI IND STRL 6.5 (GLOVE) IMPLANT
GLOVE BIOGEL PI IND STRL 7.5 (GLOVE) ×1 IMPLANT
GLOVE BIOGEL PI INDICATOR 6.5 (GLOVE) ×5
GLOVE BIOGEL PI INDICATOR 7.5 (GLOVE) ×1
GLOVE ECLIPSE 6.5 STRL STRAW (GLOVE) ×2 IMPLANT
GLOVE SS BIOGEL STRL SZ 7 (GLOVE) IMPLANT
GLOVE SUPERSENSE BIOGEL SZ 7 (GLOVE) ×1
GLOVE SURG SS PI 7.5 STRL IVOR (GLOVE) ×3 IMPLANT
GOWN PREVENTION PLUS XXLARGE (GOWN DISPOSABLE) ×4 IMPLANT
GOWN STRL NON-REIN LRG LVL3 (GOWN DISPOSABLE) ×5 IMPLANT
HEMOSTAT SNOW SURGICEL 2X4 (HEMOSTASIS) ×1 IMPLANT
HEMOSTAT SURGICEL 2X14 (HEMOSTASIS) IMPLANT
INSERT FOGARTY SM (MISCELLANEOUS) IMPLANT
KIT BASIN OR (CUSTOM PROCEDURE TRAY) ×2 IMPLANT
KIT ROOM TURNOVER OR (KITS) ×2 IMPLANT
NS IRRIG 1000ML POUR BTL (IV SOLUTION) ×5 IMPLANT
PACK CAROTID (CUSTOM PROCEDURE TRAY) ×2 IMPLANT
PAD ARMBOARD 7.5X6 YLW CONV (MISCELLANEOUS) ×4 IMPLANT
PATCH VASCULAR VASCU GUARD 1X6 (Vascular Products) ×1 IMPLANT
SHUNT CAROTID BYPASS 10 (VASCULAR PRODUCTS) IMPLANT
SHUNT CAROTID BYPASS 12FRX15.5 (VASCULAR PRODUCTS) IMPLANT
SPECIMEN JAR SMALL (MISCELLANEOUS) ×2 IMPLANT
SPONGE INTESTINAL PEANUT (DISPOSABLE) ×2 IMPLANT
SUT ETHILON 3 0 PS 1 (SUTURE) IMPLANT
SUT PROLENE 6 0 BV (SUTURE) ×2 IMPLANT
SUT PROLENE 7 0 BV 1 (SUTURE) IMPLANT
SUT PROLENE 7 0 BV1 MDA (SUTURE) ×1 IMPLANT
SUT SILK 3 0 TIES 17X18 (SUTURE)
SUT SILK 3-0 18XBRD TIE BLK (SUTURE) IMPLANT
SUT VIC AB 3-0 SH 27 (SUTURE) ×4
SUT VIC AB 3-0 SH 27X BRD (SUTURE) ×2 IMPLANT
SUT VICRYL 4-0 PS2 18IN ABS (SUTURE) ×2 IMPLANT
TOWEL OR 17X24 6PK STRL BLUE (TOWEL DISPOSABLE) ×3 IMPLANT
TOWEL OR 17X26 10 PK STRL BLUE (TOWEL DISPOSABLE) ×2 IMPLANT
WATER STERILE IRR 1000ML POUR (IV SOLUTION) ×2 IMPLANT

## 2012-12-23 NOTE — Anesthesia Procedure Notes (Signed)
Procedure Name: Intubation Date/Time: 12/23/2012 10:53 AM Performed by: Elon Alas Pre-anesthesia Checklist: Patient identified, Timeout performed, Emergency Drugs available, Suction available and Patient being monitored Patient Re-evaluated:Patient Re-evaluated prior to inductionOxygen Delivery Method: Circle system utilized Preoxygenation: Pre-oxygenation with 100% oxygen Intubation Type: IV induction Ventilation: Mask ventilation without difficulty and Oral airway inserted - appropriate to patient size Laryngoscope Size: Mac and 3 Grade View: Grade I Tube type: Oral Tube size: 7.0 mm Number of attempts: 1 Airway Equipment and Method: Stylet and LTA kit utilized Placement Confirmation: positive ETCO2,  ETT inserted through vocal cords under direct vision and breath sounds checked- equal and bilateral Secured at: 21 cm Tube secured with: Tape Dental Injury: Teeth and Oropharynx as per pre-operative assessment

## 2012-12-23 NOTE — Transfer of Care (Signed)
Immediate Anesthesia Transfer of Care Note  Patient: Michaela Flynn  Procedure(s) Performed: Procedure(s) (LRB) with comments: ENDARTERECTOMY CAROTID (Right) PATCH ANGIOPLASTY (Right) - Using 1 cm x 6 cm vascu guard patch  Patient Location: PACU  Anesthesia Type:General  Level of Consciousness: awake, alert  and oriented  Airway & Oxygen Therapy: Patient Spontanous Breathing and Patient connected to nasal cannula oxygen  Post-op Assessment: Report given to PACU RN, Post -op Vital signs reviewed and stable and Patient moving all extremities X 4  Post vital signs: Reviewed and stable  Complications: No apparent anesthesia complications

## 2012-12-23 NOTE — Telephone Encounter (Addendum)
Message copied by Shari Prows on Fri Dec 23, 2012  3:44 PM ------      Message from: [place], [place] K      Created: Fri Dec 23, 2012  3:19 PM      Regarding: schedule                   ----- Message -----         From: Dara Lords, PA         Sent: 12/23/2012   2:15 PM           To: Sharee Pimple, CMA            S/p left CEA by Dr. Myra Gianotti 12/23/12.  F/u with him in 2 weeks.            Thanks,      Lelon Mast  I scheduled an appt for the above pt for 01/02/13 at 2:15pm w/ [facility]. I left a message for the pt and also mailed an appt letter/awt

## 2012-12-23 NOTE — Progress Notes (Signed)
Dr Cay Schillings wants results of repeat u/a before surgery

## 2012-12-23 NOTE — Anesthesia Preprocedure Evaluation (Addendum)
Anesthesia Evaluation  Patient identified by MRN, date of birth, ID band  Reviewed: Allergy & Precautions, H&P , NPO status , Patient's Chart, lab work & pertinent test results, reviewed documented beta blocker date and time   History of Anesthesia Complications (+) PONV  Airway Mallampati: I TM Distance: >3 FB Neck ROM: Full    Dental  (+) Dental Advisory Given, Edentulous Upper and Edentulous Lower   Pulmonary asthma (rarely needs inhaler) , neg COPD breath sounds clear to auscultation  Pulmonary exam normal       Cardiovascular hypertension, Pt. on home beta blockers and Pt. on medications + Peripheral Vascular Disease and + DOE Rhythm:Regular Rate:Normal  '13 stress test: normal perfusion, no ischemia, EF 82%   Neuro/Psych PSYCHIATRIC DISORDERS Anxiety Depression negative neurological ROS     GI/Hepatic negative GI ROS, Neg liver ROS,   Endo/Other    Renal/GU negative Renal ROS     Musculoskeletal  (+) Arthritis -,   Abdominal   Peds  Hematology  (+) REFUSES BLOOD PRODUCTS (accepts cell saver and albumin), JEHOVAH'S WITNESS  Anesthesia Other Findings   Reproductive/Obstetrics                         Anesthesia Physical Anesthesia Plan  ASA: III  Anesthesia Plan: General   Post-op Pain Management:    Induction: Intravenous  Airway Management Planned: Oral ETT  Additional Equipment: Arterial line  Intra-op Plan:   Post-operative Plan: Extubation in OR  Informed Consent: I have reviewed the patients History and Physical, chart, labs and discussed the procedure including the risks, benefits and alternatives for the proposed anesthesia with the patient or authorized representative who has indicated his/her understanding and acceptance.   Dental advisory given  Plan Discussed with: Anesthesiologist, Surgeon and CRNA  Anesthesia Plan Comments: (Plan routine monitors, A line, GETA)        Anesthesia Quick Evaluation

## 2012-12-23 NOTE — Interval H&P Note (Signed)
History and Physical Interval Note:  12/23/2012 10:04 AM  Michaela Flynn  has presented today for surgery, with the diagnosis of RIGHT ICA STENOSIS  The various methods of treatment have been discussed with the patient and family. After consideration of risks, benefits and other options for treatment, the patient has consented to  Procedure(s) (LRB) with comments: ENDARTERECTOMY CAROTID (Right) as a surgical intervention .  The patient's history has been reviewed, patient examined, no change in status, stable for surgery.  I have reviewed the patient's chart and labs.  Questions were answered to the patient's satisfaction.     [name] IV, V. WELLS

## 2012-12-23 NOTE — Progress Notes (Signed)
Utilization review completed.  

## 2012-12-23 NOTE — H&P (View-Only) (Signed)
Vascular and Vein Specialist of [place]   Patient name: Michaela Flynn MRN: [id number] DOB: 08/19/1925 Sex: female   Referred by: Dr. Kim  Reason for referral:  Chief Complaint  Patient presents with  . New Evaluation    right carotid stenosis    HISTORY OF PRESENT ILLNESS: This is a very pleasant 79-year-old female that is referred today for carotid stenosis. It is difficult to ascertain whether or not she is symptomatic. She has had a fall within the past 3 months where she missed the bed when sitting down. She has had some episodes of slurred speech. She has seen an ophthalmologist that thought she had opted nerve atherosclerotic changes. She has been managed with an aspirin. She denies weakness in either extremity.  She does not have classic symptoms of amaurosis fugax.  The patient does have hypercholesterolemia but is not on a statin because of elevated liver enzymes in the past. She is medically managed for her hypertension. She denies any significant cardiac history. She has had a echo in the past to evaluate for any pericardial effusion. This was negative.  Past Medical History  Diagnosis Date  . Hyperlipidemia   . Hypertension   . Anxiety and depression   . Glaucoma   . Abnormal LFTs     secondary to cholesterol  medication  . Fibrocystic breast disease   . Diverticulitis   . Shingles 2010  . S/P tonsillectomy and adenoidectomy 1975  . Cancer 1972    Nonmelanoma resection  . Arthritis     Lower back," Osteo"  . Carotid artery occlusion     Bruit    Past Surgical History  Procedure Date  . Cholecystectomy 1956  . Eye surgery     Cataract  . Appendectomy 1956  . Colonoscopy 2008    [facility] [place]  . Esophagogastroduodenoscopy     ?  Zenker's per the patient  . Toe surgery     Onychomycosis    History   Social History  . Marital Status: Widowed    Spouse Name: N/A    Number of Children: N/A  . Years of Education: N/A   Occupational History  .  Not on file.   Social History Main Topics  . Smoking status: Never Smoker   . Smokeless tobacco: Never Used  . Alcohol Use: No  . Drug Use: No  . Sexually Active: Not on file   Other Topics Concern  . Not on file   Social History Narrative  . No narrative on file    Family History  Problem Relation Age of Onset  . Stroke Father   . Heart attack Father     Allergies as of 12/05/2012  . (No Known Allergies)    Current Outpatient Prescriptions on File Prior to Visit  Medication Sig Dispense Refill  . aspirin 325 MG tablet Take 325 mg by mouth daily.      . benazepril-hydrochlorthiazide (LOTENSIN HCT) 20-12.5 MG per tablet Take 1 tablet by mouth daily.      . clonazePAM (KLONOPIN) 0.5 MG tablet Take 0.5 mg by mouth 2 (two) times daily as needed.      . ESCITALOPRAM OXALATE PO Take 10 mg by mouth daily.      . metoprolol succinate (TOPROL-XL) 25 MG 24 hr tablet Take 25 mg by mouth daily.      . Polyethyl Glycol-Propyl Glycol (SYSTANE PRESERVATIVE FREE) 0.4-0.3 % SOLN Apply to eye.      . traMADol (ULTRAM) 50   MG tablet Take 50 mg by mouth every 6 (six) hours as needed.      . travoprost, benzalkonium, (TRAVATAN) 0.004 % ophthalmic solution 1 drop at bedtime.      . zolpidem (AMBIEN) 5 MG tablet Take 5 mg by mouth at bedtime as needed.         REVIEW OF SYSTEMS: Cardiovascular: No chest pain, chest pressure, palpitations, orthopnea, or dyspnea on exertion. No claudication or rest pain,  No history of DVT or phlebitis. Pulmonary: No productive cough, asthma or wheezing. Neurologic: No weakness, paresthesias, aphasia, or amaurosis. No dizziness. Poor hearing Hematologic: No bleeding problems or clotting disorders. Musculoskeletal: No joint pain or joint swelling. Gastrointestinal: No blood in stool or hematemesis Genitourinary: No dysuria or hematuria. Psychiatric:: No history of major depression. Integumentary: No rashes or ulcers. Constitutional: No fever or  chills.  PHYSICAL EXAMINATION: General: The patient appears their stated age.  Vital signs are BP 140/71  Pulse 89  Resp 18  Ht 5' 3" (1.6 m)  Wt 155 lb (70.308 kg)  BMI 27.46 kg/m2 HEENT:  No gross abnormalities Pulmonary: Respirations are non-labored Abdomen: Soft and non-tender  Musculoskeletal: There are no major deformities.   Neurologic: No focal weakness or paresthesias are detected, Skin: There are no ulcer or rashes noted. Psychiatric: The patient has normal affect. Cardiovascular: There is a regular rate and rhythm without significant murmur appreciated. I could not hear a carotid bruit today pedal pulses were not palpable. There was a fair amount of edema in both ankles.  Diagnostic Studies: Outside carotid imaging reveals greater than 70% right carotid stenosis and less than 50% left carotid stenosis. The imaging was repeated in our office today which shows 80-99% stenosis on the right. In that diastolic velocities were to 35. The bifurcation was noted to be mid to high  Outside Studies/Documentation Historical records were reviewed.  They showed right carotid stenosis  Medication Changes: None  Assessment:  Possible symptomatic right carotid stenosis Plan: It is difficult to tell by the patient's history whether or not she has had symptomatology from her right carotid stenosis. She is neurologically intact today. Based on the degree of stenosis we have decided to proceed with intervention. Because of her age, I am reluctant to proceed with carotid stenting. We discussed the risks and benefits of carotid endarterectomy, which include the risk of stroke, the risk of nerve injury, and the risk of cardiopulmonary complications. Prior to proceeding I am going to obtain a Myoview to evaluate her heart. In addition the patient is a Jehovah's Witness. She will bring a living Will with her however in our discussion, she is accepting of blood substitutes as well as Cell Saver blood.  She states that she would rather die than receive a blood transfusion. I have scheduled her for a right carotid endarterectomy on Friday, January 10     V. Wells Brabham IV, M.D. [facility] of [place] Office: [phone] Pager:  [phone]   

## 2012-12-23 NOTE — Progress Notes (Signed)
U/a results reported to dr Cay Schillings. To procede with surgery

## 2012-12-23 NOTE — Progress Notes (Signed)
Pt admitted from PACU, oriented to unit and routines, pain management and safety discussed, family at bedside, all verbalize understanding

## 2012-12-23 NOTE — Op Note (Addendum)
[facility]  Patient name: Michaela Flynn MRN: [id number] DOB: 11-29-1925 Sex: female  12/23/2012 Pre-operative Diagnosis: ? symptomatic   right carotid stenosis Post-operative diagnosis:  Same Surgeon:  Jorge Ny Assistants:  Narda Amber Procedure:   1: right carotid Endarterectomy with bovine pericardial patch angioplasty #2: Resection with primary anastomosis of the right internal carotid artery Anesthesia:  General Blood Loss:  See anesthesia record Specimens:  Carotid Plaque to pathology  Findings:  80 %stenosis; Thrombus:  none Indications:  This is an 79 year old female who was found to have a high-grade right carotid stenosis. She has had potentially, some symptoms that are related to this however this is not very clear. She comes in today for carotid endarterectomy.  Her initial UA was positive.  It was repeated today and it looked better, despite not being a clean catch.  She is now asymptomatic, and she had been having urinary frequency.  I discussed at length with the family the options of rescheduling the procedure versus proceeding today with antibiotic coverage for her UTI.  We are all in agreement to proceed.  They understand the slightly increased risk of infection.   Procedure:  The patient was identified in the holding area and taken to [facility] OR ROOM 12  The patient was then placed supine on the table.   General endotrachial anesthesia was administered.  The patient was prepped and draped in the usual sterile fashion.  A time out was called and antibiotics were administered.  The incision was made along the anterior border of the right sternocleidomastoid muscle.  Cautery was used to dissect through the subcutaneous tissue.  The platysma muscle was divided with cautery.  The internal jugular vein was exposed along its anterior medial border.  The common facial vein was exposed and then divided between 2-0 silk ties and metal clips.  The common  carotid artery was then circumferentially exposed and encircled with an umbilical tape.  The vagus nerve was identified and protected.  Next sharp dissection was used to expose the external carotid artery and the superior thyroid artery.  The were encircled with a blue vessel loop and a 2-0 silk tie respectively.  Finally, the internal carotid was carefully dissected free.  An umbilical tape was placed around the internal carotid artery distal to the diseased segment.  The hypoglossal nerve was visualized throughout and protected.  The patient was given systemic heparinization.  A bovine carotid patch was selected and prepared on the back table.  A 10 french shunt was also prepared.  After blood pressure readings were appropriate and the heparin had been given time to circulate, the internal carotid artery was occluded with a baby Gregory clamp.  The external and common carotid arteries were then occluded with vascular clamps and the 2-0 tie tightened on the superior thyroid artery.  A #11 blade was used to make an arteriotomy in the common carotid artery.  This was extended with Potts scissors along the anterior and lateral border of the common and internal carotid artery.  Approximately 80% stenosis was identified.  There was no thrombus identified.  The 10 french shunt was not placed due to to the degree of backbleeding from the internal carotid artery..  A kleiner kuntz elevator was used to perform endarterectomy.  An eversion endarterectomy was performed in the external carotid artery.  A good distal endpoint was obtained in the internal carotid artery.  The specimen was removed and sent to pathology.  Heparinized saline was used to irrigate the endarterectomized field.  All potential embolic debris was removed.  Bovine pericardial patch angioplasty was then performed using a running 6-0 Prolene.  The common internal and external carotid arteries were all appropriately flushed. The artery was again irrigated  with heparin saline.  The anastomosis was then secured. The clamp was first released on the external carotid artery followed by the common carotid artery approximately 30 seconds later, bloodflow was reestablish through the internal carotid artery.  Next, a hand-held  Doppler was used to evaluate the signals in the common, external, and internal  carotid arteries, all of which had appropriate signals. I then administered  50 mg protamine. The wound was then irrigated.  After hemostasis was achieved, the carotid sheath was reapproximated with 3-0 Vicryl. The  platysma muscle was reapproximated with running 3-0 Vicryl. The skin  was closed with 4-0 Vicryl. Dermabond was placed on the skin. The  patient was then successfully extubated. Her neurologic exam was  similar to his preprocedural exam. The patient was then taken to recovery room  in stable condition. There were no complications.     Disposition:  To PACU in stable condition.  Relevant Operative Details:  The patient had a redundant internal carotid artery. I resected approximately 2 cm of the internal carotid artery. I repaired the back wall with a running 7-0 Prolene. The hypoglossal nerve was not visualized as it was superior to our working field. The patient had a very smooth approximately 80% plaque without evidence of ulceration or thrombus. A shunt was not required due to the backbleeding from the internal carotid artery. A bovine pericardial patch was placed. The patient remained neurologically intact after the procedure.  Juleen China, M.D. [facility] [place] Office: [phone] Pager:  [phone]

## 2012-12-23 NOTE — Anesthesia Postprocedure Evaluation (Signed)
  Anesthesia Post-op Note  Patient: Michaela Flynn  Procedure(s) Performed: Procedure(s) (LRB) with comments: ENDARTERECTOMY CAROTID (Right) PATCH ANGIOPLASTY (Right) - Using 1 cm x 6 cm vascu guard patch  Patient Location: PACU  Anesthesia Type:General  Level of Consciousness: awake, alert , oriented and patient cooperative  Airway and Oxygen Therapy: Patient Spontanous Breathing and Patient connected to nasal cannula oxygen  Post-op Pain: none  Post-op Assessment: Post-op Vital signs reviewed, Patient's Cardiovascular Status Stable, Respiratory Function Stable, Patent Airway, No signs of Nausea or vomiting and Pain level controlled  Post-op Vital Signs: Reviewed and stable  Complications: No apparent anesthesia complications

## 2012-12-23 NOTE — Preoperative (Signed)
Beta Blockers   Reason not to administer Beta Blockers:Not Applicable, pt took lopressor 1/10

## 2012-12-23 NOTE — Progress Notes (Signed)
Dr. Jean Rosenthal called for sign out

## 2012-12-24 ENCOUNTER — Encounter (HOSPITAL_COMMUNITY): Payer: Self-pay | Admitting: Acute Care

## 2012-12-24 LAB — BASIC METABOLIC PANEL
CO2: 27 mEq/L (ref 19–32)
Chloride: 105 mEq/L (ref 96–112)
Creatinine, Ser: 0.93 mg/dL (ref 0.50–1.10)
GFR calc Af Amer: 63 mL/min — ABNORMAL LOW (ref >90)
Potassium: 3.4 mEq/L — ABNORMAL LOW (ref 3.5–5.1)
Sodium: 140 mEq/L (ref 135–145)

## 2012-12-24 LAB — CBC
Platelets: 183 10*3/uL (ref 150–400)
RBC: 3.93 MIL/uL (ref 3.87–5.11)
RDW: 12.9 % (ref 11.5–15.5)
WBC: 7 10*3/uL (ref 4.0–10.5)

## 2012-12-24 MED ORDER — SULFAMETHOXAZOLE-TMP DS 800-160 MG PO TABS
1.0000 | ORAL_TABLET | Freq: Two times a day (BID) | ORAL | Status: DC
  Administered 2012-12-24: 1 via ORAL
  Filled 2012-12-24 (×2): qty 1

## 2012-12-24 MED ORDER — TRAMADOL HCL 50 MG PO TABS: 50.0000 mg | ORAL_TABLET | Freq: Four times a day (QID) | ORAL | Status: DC | PRN

## 2012-12-24 MED ORDER — SULFAMETHOXAZOLE-TMP DS 800-160 MG PO TABS: 1.0000 | ORAL_TABLET | Freq: Two times a day (BID) | ORAL | Status: DC

## 2012-12-24 MED ORDER — CIPROFLOXACIN HCL 500 MG PO TABS: 500.0000 mg | ORAL_TABLET | Freq: Two times a day (BID) | ORAL | Status: DC

## 2012-12-24 NOTE — Progress Notes (Signed)
Pt discharged home per MD order. All discharge instructions were reviewed and all questions answered. Right neck incision was clean, dry, and intact without signs or symptoms of infection.

## 2012-12-24 NOTE — Progress Notes (Addendum)
[facility] Progress Note  12/24/2012 7:57 AM POD 1  Subjective:  No complaints  VSS Filed Vitals:   12/24/12 0411  BP:   Pulse:   Temp: 97.8 F (36.6 C)  Resp:      Physical Exam: Neuro:  In tact Incision:  C/d/i-she does have some ecchymosis under her right eye  CBC    Component Value Date/Time   WBC 7.0 12/24/2012 0434   RBC 3.93 12/24/2012 0434   HGB 11.7* 12/24/2012 0434   HCT 34.9* 12/24/2012 0434   PLT 183 12/24/2012 0434   MCV 88.8 12/24/2012 0434   MCH 29.8 12/24/2012 0434   MCHC 33.5 12/24/2012 0434   RDW 12.9 12/24/2012 0434   LYMPHSABS 1.8 01/10/2009 1201   MONOABS 0.6 01/10/2009 1201   EOSABS 0.3 01/10/2009 1201   BASOSABS 0.1 01/10/2009 1201    BMET    Component Value Date/Time   NA 140 12/24/2012 0434   K 3.4* 12/24/2012 0434   CL 105 12/24/2012 0434   CO2 27 12/24/2012 0434   GLUCOSE 123* 12/24/2012 0434   BUN 17 12/24/2012 0434   CREATININE 0.93 12/24/2012 0434   CALCIUM 8.5 12/24/2012 0434   GFRNONAA 54* 12/24/2012 0434   GFRAA 63* 12/24/2012 0434     Intake/Output Summary (Last 24 hours) at 12/24/12 0757 Last data filed at 12/24/12 0412  Gross per 24 hour  Intake   1560 ml  Output   1165 ml  Net    395 ml      Assessment/Plan:  This is a 79 y.o. female who is s/p left CEA POD 1  ecchymosis under right eye possibly from tape -pt doing well this am -will discharge home -f/u with Dr. Myra Gianotti in 2 weeks. -home on 5 days of Cipro for UTI  Agree with above assessment Neurologic exam normal Right neck wound looks good Ambulating well-voiding well-good appetite DC home today  Doreatha Massed, PA-C [facility] [phone]

## 2012-12-24 NOTE — Discharge Summary (Signed)
[facility] Discharge Summary  Michaela Flynn 1925/08/23 79 y.o. female  [id number]  Admission Date: 12/23/2012  Discharge Date: 12/24/12  Physician: Nada Libman, MD  Admission Diagnosis: RIGHT ICA STENOSIS   HPI:   This is a 79 y.o. female that is referred today for carotid stenosis. It is difficult to ascertain whether or not she is symptomatic. She has had a fall within the past 3 months where she missed the bed when sitting down. She has had some episodes of slurred speech. She has seen an ophthalmologist that thought she had opted nerve atherosclerotic changes. She has been managed with an aspirin. She denies weakness in either extremity. She does not have classic symptoms of amaurosis fugax.  The patient does have hypercholesterolemia but is not on a statin because of elevated liver enzymes in the past. She is medically managed for her hypertension. She denies any significant cardiac history. She has had a echo in the past to evaluate for any pericardial effusion. This was negative.  Hospital Course:  The patient was admitted to the hospital and taken to the operating room on 12/23/2012 and underwent right carotid endarterectomy.  The pt tolerated the procedure well and was transported to the PACU in good condition.   By POD 1, the pt neuro status in tact and she is doing well.  She does have some ecchymosis under her right eye that is possibly from the tape.  The remainder of the hospital course consisted of increasing mobilization and increasing intake of solids without difficulty.    Basename 12/24/12 0434  NA 140  K 3.4*  CL 105  CO2 27  GLUCOSE 123*  BUN 17  CALCIUM 8.5    Basename 12/24/12 0434  WBC 7.0  HGB 11.7*  HCT 34.9*  PLT 183   No results found for this basename: INR:2 in the last 72 hours   Discharge Instructions:   The patient is discharged to home with extensive instructions on wound care and progressive ambulation.  They are  instructed not to drive or perform any heavy lifting until returning to see the physician in his office.  Discharge Orders    Future Appointments: Provider: Department: Dept Phone: Center:   01/02/2013 2:15 PM Nada Libman, MD [facility] [phone] [facility]     Future Orders Please Complete By Expires   Resume previous diet      Driving Restrictions      Comments:   No driving for 2 weeks   Lifting restrictions      Comments:   No lifting for 4 weeks   Call MD for:  temperature >100.5      Call MD for:  redness, tenderness, or signs of infection (pain, swelling, bleeding, redness, odor or green/yellow discharge around incision site)      Call MD for:  severe or increased pain, loss or decreased feeling  in affected limb(s)      CAROTID Sugery: Call MD for difficulty swallowing or speaking; weakness in arms or legs that is a new symtom; severe headache.  If you have increased swelling in the neck and/or  are having difficulty breathing, CALL 911      Discharge wound care:      Comments:   Shower daily with soap and water starting 12/25/12      Discharge Diagnosis:  RIGHT ICA STENOSIS  Secondary Diagnosis: Patient Active Problem List  Diagnosis  . Occlusion and stenosis of carotid artery without mention  of cerebral infarction   Past Medical History  Diagnosis Date  . Hyperlipidemia   . Hypertension   . Anxiety and depression   . Glaucoma   . Abnormal LFTs     secondary to cholesterol  medication  . Fibrocystic breast disease   . Diverticulitis   . Shingles 2010  . S/P tonsillectomy and adenoidectomy 1975  . Cancer 1972    Nonmelanoma resection  . Arthritis     Lower back," Osteo"  . Carotid artery occlusion     Bruit  . PONV (postoperative nausea and vomiting)      Jereline, Ticer  [facility] Medication Instructions [id number]   Printed on:12/24/12 0803  Medication Information                    Polyethyl Glycol-Propyl Glycol (SYSTANE  PRESERVATIVE FREE) 0.4-0.3 % SOLN Place 1 drop into both eyes daily as needed. For dry eyes           travoprost, benzalkonium, (TRAVATAN) 0.004 % ophthalmic solution Place 1 drop into both eyes at bedtime.            benazepril-hydrochlorthiazide (LOTENSIN HCT) 20-12.5 MG per tablet Take 1 tablet by mouth daily.           aspirin 325 MG tablet Take 325 mg by mouth daily.           clonazePAM (KLONOPIN) 0.5 MG tablet Take 0.5 mg by mouth 2 (two) times daily as needed. For anxiety           escitalopram (LEXAPRO) 10 MG tablet Take 5 mg by mouth daily.           metoprolol tartrate (LOPRESSOR) 25 MG tablet Take 25 mg by mouth 2 (two) times daily.           ciprofloxacin (CIPRO) 500 MG tablet Take 1 tablet (500 mg total) by mouth 2 (two) times daily. #10 NR          traMADol (ULTRAM) 50 MG tablet Take 1 tablet (50 mg total) by mouth every 6 (six) hours as needed. For pain #30 NR            Disposition: home  Patient's condition: is Good  Follow up: 1. Dr.  Myra Gianotti in 2 weeks.   Doreatha Massed, PA-C [facility] [phone] 12/24/2012  8:03 AM   Agree with above Discharge home today return to see Dr. Myra Gianotti in 2 weeks

## 2012-12-24 NOTE — Plan of Care (Signed)
Problem: Consults Goal: Diagnosis CEA/CES/AAA Stent Carotid Endarterectomy (CEA)     

## 2012-12-24 NOTE — Plan of Care (Signed)
Problem: Consults Goal: Diagnosis CEA/CES/AAA Stent Outcome: Completed/Met Date Met:  12/24/12 Carotid Endarterectomy (CEA)

## 2012-12-25 LAB — URINE CULTURE

## 2012-12-26 ENCOUNTER — Encounter (HOSPITAL_COMMUNITY): Payer: Self-pay | Admitting: Surgery

## 2012-12-30 ENCOUNTER — Encounter: Payer: Self-pay | Admitting: Surgery

## 2013-01-02 ENCOUNTER — Ambulatory Visit (INDEPENDENT_AMBULATORY_CARE_PROVIDER_SITE_OTHER): Payer: Medicare Other | Admitting: Surgery

## 2013-01-02 ENCOUNTER — Encounter: Payer: Self-pay | Admitting: Surgery

## 2013-01-02 VITALS — BP 115/62 | HR 60 | Ht 67.0 in | Wt 154.8 lb

## 2013-01-02 DIAGNOSIS — I6529 Occlusion and stenosis of unspecified carotid artery: Secondary | ICD-10-CM

## 2013-01-02 NOTE — Progress Notes (Signed)
The patient is back today for followup. She is status post right carotid endarterectomy on 12/23/2012. Intraoperative findings included a stenosis of 80%. She did require resection of a portion of the internal carotid artery with primary anastomosis. Her operation was done in the setting of a questionable TIA. She was treated for a resolving urinary tract infection during her stay. She was discharged to home on postoperative day 1. She contacted the office on postoperative day 2 secondary to head and neck swelling as well as tongue swelling. This did resolve with Benadryl. The etiology of this is unknown. She has had no neurologic symptoms.  On examination she remains neurologically intact. Her incision is healing nicely.  Overall she is doing very well. There was no evidence of facial or tongue swelling. I suspect she had an underlying reaction to a medication which did resolve with Benadryl. I am scheduling her to come back to see me in 6 months with a repeat carotid ultrasound. She had approximately 50% stenosis on the contralateral side. She has a reaction to a statin in the past. I would consider attempting to restart a statin. I would defer to her primary care physician for this matter.

## 2013-01-09 ENCOUNTER — Other Ambulatory Visit: Payer: Self-pay | Admitting: *Deleted

## 2013-01-09 DIAGNOSIS — I6529 Occlusion and stenosis of unspecified carotid artery: Secondary | ICD-10-CM

## 2013-04-15 ENCOUNTER — Encounter: Payer: Self-pay | Admitting: Cardiovascular Disease

## 2013-07-07 ENCOUNTER — Encounter: Payer: Self-pay | Admitting: Surgery

## 2013-07-10 ENCOUNTER — Other Ambulatory Visit (INDEPENDENT_AMBULATORY_CARE_PROVIDER_SITE_OTHER): Payer: Medicare Other | Admitting: *Deleted

## 2013-07-10 ENCOUNTER — Ambulatory Visit (INDEPENDENT_AMBULATORY_CARE_PROVIDER_SITE_OTHER): Payer: Medicare Other | Admitting: Surgery

## 2013-07-10 ENCOUNTER — Encounter: Payer: Self-pay | Admitting: Surgery

## 2013-07-10 DIAGNOSIS — Z48812 Encounter for surgical aftercare following surgery on the circulatory system: Secondary | ICD-10-CM

## 2013-07-10 DIAGNOSIS — I6529 Occlusion and stenosis of unspecified carotid artery: Secondary | ICD-10-CM

## 2013-07-10 NOTE — Progress Notes (Signed)
[facility]   Patient name: Michaela Flynn MRN: [id number] DOB: Oct 16, 1925 Sex: female     Chief Complaint  Patient presents with  . Carotid    6 month follow up, vascular labs today    HISTORY OF PRESENT ILLNESS: The patient comes back today for followup. She is status post right carotid endarterectomy on 12/23/2012. This was done for possible symptomatic stenosis as the patient had had a few falls and possible slurred speech. Intraoperative findings included a redundant carotid artery with resection and primary anastomosis of the internal carotid artery. She had 80% stenosis. She has no complaints today and is neurologically without issue. No further episodes of slurred speech. She has tried to start a statin but cannot afford the one she was started on. She has a history of taking a statin in the past and had elevated liver enzymes.  Past Medical History  Diagnosis Date  . Hyperlipidemia   . Hypertension   . Anxiety and depression   . Glaucoma   . Abnormal LFTs     secondary to cholesterol  medication  . Fibrocystic breast disease   . Diverticulitis   . Shingles 2010  . S/P tonsillectomy and adenoidectomy 1975  . Cancer 1972    Nonmelanoma resection  . Arthritis     Lower back," Osteo"  . Carotid artery occlusion     Bruit  . PONV (postoperative nausea and vomiting)     Past Surgical History  Procedure Laterality Date  . Cholecystectomy  1956  . Eye surgery      Cataract  . Appendectomy  1956  . Colonoscopy  2008    [facility] in [place]  . Esophagogastroduodenoscopy      ?  Zenker's per the patient  . Toe surgery      Onychomycosis  . Tonsillectomy and adenoidectomy    . Abdominal hysterectomy  1980's  . Endarterectomy  12/23/2012    Procedure: ENDARTERECTOMY CAROTID;  Surgeon: Nada Libman, MD;  Location: [facility] OR;  Service: Vascular;  Laterality: Right;  . Patch angioplasty  12/23/2012    Procedure: PATCH ANGIOPLASTY;  Surgeon: Nada Libman, MD;  Location: [facility] OR;  Service: Vascular;  Laterality: Right;  Using 1 cm x 6 cm vascu guard patch  . Carotid endarterectomy      History   Social History  . Marital Status: Widowed    Spouse Name: N/A    Number of Children: N/A  . Years of Education: N/A   Occupational History  . Not on file.   Social History Main Topics  . Smoking status: Never Smoker   . Smokeless tobacco: Never Used  . Alcohol Use: No  . Drug Use: No  . Sexually Active: Not on file   Other Topics Concern  . Not on file   Social History Narrative  . No narrative on file    Family History  Problem Relation Age of Onset  . Stroke Father   . Heart attack Father     Allergies as of 07/10/2013  . (No Known Allergies)    Current Outpatient Prescriptions on File Prior to Visit  Medication Sig Dispense Refill  . aspirin 325 MG tablet Take 325 mg by mouth daily.      . benazepril-hydrochlorthiazide (LOTENSIN HCT) 20-12.5 MG per tablet Take 1 tablet by mouth daily.      . clonazePAM (KLONOPIN) 0.5 MG tablet Take 0.5 mg by mouth 2 (two) times daily as needed.  For anxiety      . escitalopram (LEXAPRO) 10 MG tablet Take 5 mg by mouth daily.      . metoprolol tartrate (LOPRESSOR) 25 MG tablet Take 25 mg by mouth 2 (two) times daily.      Bertram Gala Glycol-Propyl Glycol (SYSTANE PRESERVATIVE FREE) 0.4-0.3 % SOLN Place 1 drop into both eyes daily as needed. For dry eyes      . sulfamethoxazole-trimethoprim (BACTRIM DS) 800-160 MG per tablet Take 1 tablet by mouth every 12 (twelve) hours.  10 tablet  0  . traMADol (ULTRAM) 50 MG tablet Take 1 tablet (50 mg total) by mouth every 6 (six) hours as needed. For pain  30 tablet  0  . travoprost, benzalkonium, (TRAVATAN) 0.004 % ophthalmic solution Place 1 drop into both eyes at bedtime.        No current facility-administered medications on file prior to visit.     REVIEW OF SYSTEMS: Please see history of present illness, otherwise all systems  negative  PHYSICAL EXAMINATION:   Vital signs are BP 188/66  Pulse 51  Resp 16  Ht 5\' 3"  (1.6 m)  Wt 159 lb (72.122 kg)  BMI 28.17 kg/m2  SpO2 99% General: The patient appears their stated age. HEENT:  No gross abnormalities Pulmonary:  Non labored breathing Musculoskeletal: There are no major deformities. Neurologic: No focal weakness or paresthesias are detected, Skin: There are no ulcer or rashes noted. Psychiatric: The patient has normal affect. Cardiovascular: There is a regular rate and rhythm without significant murmur appreciated.   Diagnostic Studies Carotid ultrasound was ordered and reviewed today. This shows a widely patent right carotid endarterectomy site, the left side is less than 50% stenosis.  Assessment: Status post right carotid endarterectomy Plan: Patient is doing very well at this time with no evidence of stenosis on her ultrasound. I again stressed the need to have her on a low dose statin. She is in the process of trying to find one that suture financial options. I have scheduled her to come back in one year with a repeat carotid ultrasound.  Jorge Ny, M.D. [facility] [place] Office: [phone] Pager:  [phone]

## 2013-07-11 NOTE — Addendum Note (Signed)
Addended by: Sharee Pimple on: 07/11/2013 09:09 AM   Modules accepted: Orders

## 2014-01-30 ENCOUNTER — Other Ambulatory Visit: Payer: Self-pay | Admitting: Surgery

## 2014-01-30 DIAGNOSIS — Z48812 Encounter for surgical aftercare following surgery on the circulatory system: Secondary | ICD-10-CM

## 2014-01-30 DIAGNOSIS — I6529 Occlusion and stenosis of unspecified carotid artery: Secondary | ICD-10-CM

## 2014-06-07 ENCOUNTER — Emergency Department (HOSPITAL_COMMUNITY): Payer: Medicare Other

## 2014-06-07 ENCOUNTER — Encounter (HOSPITAL_COMMUNITY): Payer: Self-pay | Admitting: Emergency Medicine

## 2014-06-07 ENCOUNTER — Emergency Department (HOSPITAL_COMMUNITY)
Admission: EM | Admit: 2014-06-07 | Discharge: 2014-06-07 | Disposition: A | Discharge status: Home / Self Care | Payer: Medicare Other | Attending: Emergency Medicine | Admitting: Emergency Medicine

## 2014-06-07 DIAGNOSIS — Z8639 Personal history of other endocrine, nutritional and metabolic disease: Secondary | ICD-10-CM | POA: Insufficient documentation

## 2014-06-07 DIAGNOSIS — Y9289 Other specified places as the place of occurrence of the external cause: Secondary | ICD-10-CM | POA: Insufficient documentation

## 2014-06-07 DIAGNOSIS — S81811A Laceration without foreign body, right lower leg, initial encounter: Secondary | ICD-10-CM

## 2014-06-07 DIAGNOSIS — Z7982 Long term (current) use of aspirin: Secondary | ICD-10-CM | POA: Insufficient documentation

## 2014-06-07 DIAGNOSIS — Z9861 Coronary angioplasty status: Secondary | ICD-10-CM | POA: Insufficient documentation

## 2014-06-07 DIAGNOSIS — W1809XA Striking against other object with subsequent fall, initial encounter: Secondary | ICD-10-CM | POA: Insufficient documentation

## 2014-06-07 DIAGNOSIS — I10 Essential (primary) hypertension: Secondary | ICD-10-CM | POA: Insufficient documentation

## 2014-06-07 DIAGNOSIS — S51009A Unspecified open wound of unspecified elbow, initial encounter: Secondary | ICD-10-CM | POA: Insufficient documentation

## 2014-06-07 DIAGNOSIS — W010XXA Fall on same level from slipping, tripping and stumbling without subsequent striking against object, initial encounter: Secondary | ICD-10-CM | POA: Insufficient documentation

## 2014-06-07 DIAGNOSIS — F329 Major depressive disorder, single episode, unspecified: Secondary | ICD-10-CM | POA: Insufficient documentation

## 2014-06-07 DIAGNOSIS — S51012A Laceration without foreign body of left elbow, initial encounter: Secondary | ICD-10-CM

## 2014-06-07 DIAGNOSIS — F411 Generalized anxiety disorder: Secondary | ICD-10-CM | POA: Insufficient documentation

## 2014-06-07 DIAGNOSIS — Z8619 Personal history of other infectious and parasitic diseases: Secondary | ICD-10-CM | POA: Insufficient documentation

## 2014-06-07 DIAGNOSIS — F3289 Other specified depressive episodes: Secondary | ICD-10-CM | POA: Insufficient documentation

## 2014-06-07 DIAGNOSIS — Z85820 Personal history of malignant melanoma of skin: Secondary | ICD-10-CM | POA: Insufficient documentation

## 2014-06-07 DIAGNOSIS — Z8719 Personal history of other diseases of the digestive system: Secondary | ICD-10-CM | POA: Insufficient documentation

## 2014-06-07 DIAGNOSIS — Z9889 Other specified postprocedural states: Secondary | ICD-10-CM | POA: Insufficient documentation

## 2014-06-07 DIAGNOSIS — H409 Unspecified glaucoma: Secondary | ICD-10-CM | POA: Insufficient documentation

## 2014-06-07 DIAGNOSIS — Y9389 Activity, other specified: Secondary | ICD-10-CM | POA: Insufficient documentation

## 2014-06-07 DIAGNOSIS — Z9089 Acquired absence of other organs: Secondary | ICD-10-CM | POA: Insufficient documentation

## 2014-06-07 DIAGNOSIS — Z862 Personal history of diseases of the blood and blood-forming organs and certain disorders involving the immune mechanism: Secondary | ICD-10-CM | POA: Insufficient documentation

## 2014-06-07 DIAGNOSIS — M47817 Spondylosis without myelopathy or radiculopathy, lumbosacral region: Secondary | ICD-10-CM | POA: Insufficient documentation

## 2014-06-07 DIAGNOSIS — Z79899 Other long term (current) drug therapy: Secondary | ICD-10-CM | POA: Insufficient documentation

## 2014-06-07 MED ORDER — TRAMADOL HCL 50 MG PO TABS
50.0000 mg | ORAL_TABLET | Freq: Once | ORAL | Status: AC
  Administered 2014-06-07: 50 mg via ORAL
  Filled 2014-06-07: qty 1

## 2014-06-07 NOTE — ED Notes (Signed)
Pt ambulated to restroom w/o difficulty using a walker.  MD notified.

## 2014-06-07 NOTE — ED Notes (Addendum)
Patient arrives via EMS due to c/o fall at home (where she lives by herself)--states that she thinks she tripped over her walker while ambulating to/from bathroom NO LOC--patient did not hit head No N/V Patient with laceration to right leg/shin due to fall and skin tear to left elbow--bleeding controlled

## 2014-06-07 NOTE — ED Notes (Signed)
Patient transported to X-ray 

## 2014-06-07 NOTE — ED Notes (Signed)
Small amount of bleeding noted to be coming through gauze dressing to right leg--additional gauze applied Patient's daughter upset r/t wait time--apologized for delay and informed that EDP will assess patient shortly Patient denies needs Side rails up, call bell in reach

## 2014-06-07 NOTE — ED Provider Notes (Addendum)
CSN: [id number]     Arrival date & time 06/07/14  0545 History   First MD Initiated Contact with Patient 06/07/14 [phone]     Chief Complaint  Patient presents with  . Fall     (Consider location/radiation/quality/duration/timing/severity/associated sxs/prior Treatment) The history is provided by the patient.  Michaela Flynn is a 80 y.o. female hx of HL, HTN here with fall. She usually walks with a walker and went to the bathroom today and suddenly turned and tripped and fell. She hit her right leg on the walker and was noted to have a laceration that was bleeding. Denies head injury or LOC or syncope. Also landed on left elbow as well. Tetanus up to date. Able to ambulate afterwards.    Past Medical History  Diagnosis Date  . Hyperlipidemia   . Hypertension   . Anxiety and depression   . Glaucoma   . Abnormal LFTs     secondary to cholesterol  medication  . Fibrocystic breast disease   . Diverticulitis   . Shingles 2010  . S/P tonsillectomy and adenoidectomy 1975  . Cancer 1972    Nonmelanoma resection  . Arthritis     Lower back," Osteo"  . Carotid artery occlusion     Bruit  . PONV (postoperative nausea and vomiting)    Past Surgical History  Procedure Laterality Date  . Cholecystectomy  1956  . Eye surgery      Cataract  . Appendectomy  1956  . Colonoscopy  2008    [facility] [place]  . Esophagogastroduodenoscopy      ?  Zenker's per the patient  . Toe surgery      Onychomycosis  . Tonsillectomy and adenoidectomy    . Abdominal hysterectomy  1980's  . Endarterectomy  12/23/2012    Procedure: ENDARTERECTOMY CAROTID;  Surgeon: Serafina Mitchell, MD;  Location: [facility];  Service: Vascular;  Laterality: Right;  . Patch angioplasty  12/23/2012    Procedure: PATCH ANGIOPLASTY;  Surgeon: Serafina Mitchell, MD;  Location: [facility];  Service: Vascular;  Laterality: Right;  Using 1 cm x 6 cm vascu guard patch  . Carotid endarterectomy     Family History  Problem Relation Age of  Onset  . Stroke Father   . Heart attack Father    History  Substance Use Topics  . Smoking status: Never Smoker   . Smokeless tobacco: Never Used  . Alcohol Use: No   OB History   Grav Para Term Preterm Abortions TAB SAB Ect Mult Living                 Review of Systems  Skin: Positive for wound.  All other systems reviewed and are negative.     Allergies  Review of patient's allergies indicates no known allergies.  Home Medications   Prior to Admission medications   Medication Sig Start Date End Date Taking? Authorizing Provider  aspirin 325 MG tablet Take 325 mg by mouth daily.   Yes Historical Provider, MD  benazepril-hydrochlorthiazide (LOTENSIN HCT) 20-12.5 MG per tablet Take 1 tablet by mouth daily.   Yes Historical Provider, MD  clonazePAM (KLONOPIN) 0.5 MG tablet Take 0.5 mg by mouth 2 (two) times daily as needed. For anxiety   Yes Historical Provider, MD  escitalopram (LEXAPRO) 10 MG tablet Take 5 mg by mouth daily.   Yes Historical Provider, MD  loratadine (CLARITIN) 10 MG tablet Take 10 mg by mouth daily as needed for allergies.  Yes Historical Provider, MD  metoprolol tartrate (LOPRESSOR) 25 MG tablet Take 25 mg by mouth 2 (two) times daily.   Yes Historical Provider, MD  Polyethyl Glycol-Propyl Glycol (SYSTANE PRESERVATIVE FREE) 0.4-0.3 % SOLN Place 1 drop into both eyes daily as needed. For dry eyes   Yes Historical Provider, MD  traMADol (ULTRAM) 50 MG tablet Take 1 tablet (50 mg total) by mouth every 6 (six) hours as needed. For pain 12/24/12  Yes Samantha J Rhyne, PA-C  travoprost, benzalkonium, (TRAVATAN) 0.004 % ophthalmic solution Place 1 drop into both eyes at bedtime.    Yes Historical Provider, MD   BP 167/66  Pulse 59  Temp(Src) 98.4 F (36.9 C) (Oral)  Resp 18  SpO2 94% Physical Exam  Nursing note and vitals reviewed. Constitutional:  Chronically ill   HENT:  Head: Normocephalic and atraumatic.  Mouth/Throat: Oropharynx is clear and moist.   Eyes: Conjunctivae are normal. Pupils are equal, round, and reactive to light.  Neck: Normal range of motion. Neck supple.  No midline tenderness   Cardiovascular: Normal rate and regular rhythm.   Pulmonary/Chest: Effort normal and breath sounds normal. No respiratory distress. She has no wheezes. She has no rales.  Abdominal: Soft. Bowel sounds are normal. She exhibits no distension. There is no tenderness. There is no rebound and no guarding.  Musculoskeletal:  Contusion R knee area but nl ROM. Large laceration about 15 cm mid tib area with subcutaneous tissue exposed. No exposed muscle or tendons. L elbow with skin tear over the elbow but nl ROM and minimally tender. Nl hip ROM and pelvis stable   Neurological: She is alert.  Moving all extremities   Skin: Skin is warm and dry.  Psychiatric: She has a normal mood and affect. Her behavior is normal. Thought content normal.    ED Course  Procedures (including critical care time)  LACERATION REPAIR Performed by: Shirlyn Goltz Authorized by: Shirlyn Goltz Consent: Verbal consent obtained. Risks and benefits: risks, benefits and alternatives were discussed Consent given by: patient Patient identity confirmed: provided demographic data Prepped and Draped in normal sterile fashion Wound explored  Laceration Location: L elbow  Laceration Length: 5 cm  No Foreign Bodies seen or palpated  Anesthesia: none  Irrigation method: syringe Amount of cleaning: standard  Skin closure: dermabond   Patient tolerance: Patient tolerated the procedure well with no immediate complications.   Wound care Large L tibial laceration with subcutaneous tissue. Unable to suture due to friable skin. I applied surgicel then petroleum dressing then ABD then curlix. Bleeding controlled.    Labs Review Labs Reviewed - No data to display  Imaging Review Dg Elbow Complete Left  06/07/2014   CLINICAL DATA:  Left elbow after fall.  EXAM: LEFT ELBOW -  COMPLETE 3+ VIEW  COMPARISON:  None.  FINDINGS: There is no evidence of fracture, dislocation, or joint effusion. There is no evidence of arthropathy or other focal bone abnormality. Soft tissues are unremarkable.  IMPRESSION: Normal left elbow.   Electronically Signed   By: Sabino Dick M.D.   On: 06/07/2014 07:41   Dg Tibia/fibula Right  06/07/2014   CLINICAL DATA:  Fall, medial right lower extremity laceration  EXAM: RIGHT TIBIA AND FIBULA - 2 VIEW  COMPARISON:  No similar prior exam is available at this institution for comparison or on [facility]. Knee radiographs 08/02/2010 are compared.  FINDINGS: Soft tissue gas and deformity compatible with the reported history of laceration are identified over the medial distal  lower leg. No long bone fracture identified. No radiopaque foreign body. Plantar calcaneal spurring noted.  IMPRESSION: Medial distal lower leg soft tissue gas and deformity compatible with the reported laceration.   Electronically Signed   By: Conchita Paris M.D.   On: 06/07/2014 07:41     EKG Interpretation None      MDM   Final diagnoses:  Leg laceration, right, initial encounter  Elbow laceration, left, initial encounter   Michaela Flynn is a 80 y.o. female here s/p mechanical fall with skin tears and lacerations. Tetanus up to date. Will get xray to r/o fracture.   8:58 AM Xray showed no fracture. I attempted to suture laceration but unable to since the skin was very friable. I applied dressing on to keep hemostasis. L elbow lac was dermabonded. Patient able to bear weight on the leg and I doubt tibial plateau fractures. Gave strict instructions to keep wound clean and change dressing frequently. Also patient hypertensive on arrival. When she stands up, BP becomes normal but HR only minimally increased and patient not symptomatic from it.      Wandra Arthurs, MD 06/07/14 0900

## 2014-06-07 NOTE — ED Notes (Signed)
Bed: VO59 Expected date:  Expected time:  Means of arrival:  Comments: EMS 80yo F / fall. Laceration to leg

## 2014-06-07 NOTE — ED Notes (Addendum)
Pt escorted to discharge window. Pt and Pt's daughter verbalized understanding discharge instructions. In no acute distress.  Pt additionally educated to stand up slowly and after lying, sit on the side of the bed for a couple minutes.

## 2014-06-07 NOTE — ED Notes (Addendum)
MD at bedside suturing wound.

## 2014-06-07 NOTE — ED Notes (Signed)
Pt daughter sts that she will be able to take the Pt home.

## 2014-06-07 NOTE — Discharge Instructions (Signed)
Walk with assistance or walker.   Keep wound clean and dry. Change leg wound daily.   Wound check with your doctor within a week. You are likely to have a scar.   Follow up with your doctor.   Return to ER if you have worse bleeding, falls, severe pain, fever, purulent drainage from wound.

## 2014-07-06 ENCOUNTER — Encounter: Payer: Self-pay | Admitting: Family

## 2014-07-09 ENCOUNTER — Other Ambulatory Visit (HOSPITAL_COMMUNITY): Payer: Medicare Other

## 2014-07-09 ENCOUNTER — Ambulatory Visit: Payer: Medicare Other | Admitting: Family

## 2014-07-16 DIAGNOSIS — F3289 Other specified depressive episodes: Secondary | ICD-10-CM | POA: Diagnosis not present

## 2014-07-16 DIAGNOSIS — F329 Major depressive disorder, single episode, unspecified: Secondary | ICD-10-CM | POA: Diagnosis not present

## 2014-07-16 DIAGNOSIS — I10 Essential (primary) hypertension: Secondary | ICD-10-CM | POA: Diagnosis not present

## 2014-07-16 DIAGNOSIS — IMO0001 Reserved for inherently not codable concepts without codable children: Secondary | ICD-10-CM | POA: Diagnosis not present

## 2014-07-16 DIAGNOSIS — R262 Difficulty in walking, not elsewhere classified: Secondary | ICD-10-CM | POA: Diagnosis not present

## 2014-07-16 DIAGNOSIS — F411 Generalized anxiety disorder: Secondary | ICD-10-CM | POA: Diagnosis not present

## 2014-07-16 DIAGNOSIS — Z792 Long term (current) use of antibiotics: Secondary | ICD-10-CM | POA: Diagnosis not present

## 2014-07-19 DIAGNOSIS — F411 Generalized anxiety disorder: Secondary | ICD-10-CM | POA: Diagnosis not present

## 2014-07-19 DIAGNOSIS — Z792 Long term (current) use of antibiotics: Secondary | ICD-10-CM | POA: Diagnosis not present

## 2014-07-19 DIAGNOSIS — R262 Difficulty in walking, not elsewhere classified: Secondary | ICD-10-CM | POA: Diagnosis not present

## 2014-07-19 DIAGNOSIS — F3289 Other specified depressive episodes: Secondary | ICD-10-CM | POA: Diagnosis not present

## 2014-07-19 DIAGNOSIS — I10 Essential (primary) hypertension: Secondary | ICD-10-CM | POA: Diagnosis not present

## 2014-07-19 DIAGNOSIS — F329 Major depressive disorder, single episode, unspecified: Secondary | ICD-10-CM | POA: Diagnosis not present

## 2014-07-19 DIAGNOSIS — IMO0001 Reserved for inherently not codable concepts without codable children: Secondary | ICD-10-CM | POA: Diagnosis not present

## 2014-07-23 DIAGNOSIS — E785 Hyperlipidemia, unspecified: Secondary | ICD-10-CM | POA: Diagnosis not present

## 2014-07-23 DIAGNOSIS — I6529 Occlusion and stenosis of unspecified carotid artery: Secondary | ICD-10-CM | POA: Diagnosis not present

## 2014-07-23 DIAGNOSIS — K769 Liver disease, unspecified: Secondary | ICD-10-CM | POA: Diagnosis not present

## 2014-07-23 DIAGNOSIS — I10 Essential (primary) hypertension: Secondary | ICD-10-CM | POA: Diagnosis not present

## 2014-07-24 ENCOUNTER — Other Ambulatory Visit: Payer: Self-pay | Admitting: Internal Medicine

## 2014-07-24 DIAGNOSIS — R262 Difficulty in walking, not elsewhere classified: Secondary | ICD-10-CM | POA: Diagnosis not present

## 2014-07-24 DIAGNOSIS — Z792 Long term (current) use of antibiotics: Secondary | ICD-10-CM | POA: Diagnosis not present

## 2014-07-24 DIAGNOSIS — I10 Essential (primary) hypertension: Secondary | ICD-10-CM | POA: Diagnosis not present

## 2014-07-24 DIAGNOSIS — F329 Major depressive disorder, single episode, unspecified: Secondary | ICD-10-CM | POA: Diagnosis not present

## 2014-07-24 DIAGNOSIS — F3289 Other specified depressive episodes: Secondary | ICD-10-CM | POA: Diagnosis not present

## 2014-07-24 DIAGNOSIS — F411 Generalized anxiety disorder: Secondary | ICD-10-CM | POA: Diagnosis not present

## 2014-07-24 DIAGNOSIS — IMO0001 Reserved for inherently not codable concepts without codable children: Secondary | ICD-10-CM | POA: Diagnosis not present

## 2014-07-24 DIAGNOSIS — K769 Liver disease, unspecified: Secondary | ICD-10-CM

## 2014-07-27 DIAGNOSIS — F329 Major depressive disorder, single episode, unspecified: Secondary | ICD-10-CM | POA: Diagnosis not present

## 2014-07-27 DIAGNOSIS — Z792 Long term (current) use of antibiotics: Secondary | ICD-10-CM | POA: Diagnosis not present

## 2014-07-27 DIAGNOSIS — R262 Difficulty in walking, not elsewhere classified: Secondary | ICD-10-CM | POA: Diagnosis not present

## 2014-07-27 DIAGNOSIS — F411 Generalized anxiety disorder: Secondary | ICD-10-CM | POA: Diagnosis not present

## 2014-07-27 DIAGNOSIS — F3289 Other specified depressive episodes: Secondary | ICD-10-CM | POA: Diagnosis not present

## 2014-07-27 DIAGNOSIS — IMO0001 Reserved for inherently not codable concepts without codable children: Secondary | ICD-10-CM | POA: Diagnosis not present

## 2014-07-27 DIAGNOSIS — I10 Essential (primary) hypertension: Secondary | ICD-10-CM | POA: Diagnosis not present

## 2014-07-30 ENCOUNTER — Other Ambulatory Visit: Payer: Medicare Other

## 2014-07-30 DIAGNOSIS — IMO0001 Reserved for inherently not codable concepts without codable children: Secondary | ICD-10-CM | POA: Diagnosis not present

## 2014-07-30 DIAGNOSIS — I10 Essential (primary) hypertension: Secondary | ICD-10-CM | POA: Diagnosis not present

## 2014-07-30 DIAGNOSIS — R262 Difficulty in walking, not elsewhere classified: Secondary | ICD-10-CM | POA: Diagnosis not present

## 2014-07-30 DIAGNOSIS — F329 Major depressive disorder, single episode, unspecified: Secondary | ICD-10-CM | POA: Diagnosis not present

## 2014-07-30 DIAGNOSIS — F411 Generalized anxiety disorder: Secondary | ICD-10-CM | POA: Diagnosis not present

## 2014-07-30 DIAGNOSIS — Z792 Long term (current) use of antibiotics: Secondary | ICD-10-CM | POA: Diagnosis not present

## 2014-07-30 DIAGNOSIS — F3289 Other specified depressive episodes: Secondary | ICD-10-CM | POA: Diagnosis not present

## 2014-08-01 DIAGNOSIS — R262 Difficulty in walking, not elsewhere classified: Secondary | ICD-10-CM | POA: Diagnosis not present

## 2014-08-01 DIAGNOSIS — Z792 Long term (current) use of antibiotics: Secondary | ICD-10-CM | POA: Diagnosis not present

## 2014-08-01 DIAGNOSIS — F3289 Other specified depressive episodes: Secondary | ICD-10-CM | POA: Diagnosis not present

## 2014-08-01 DIAGNOSIS — IMO0001 Reserved for inherently not codable concepts without codable children: Secondary | ICD-10-CM | POA: Diagnosis not present

## 2014-08-01 DIAGNOSIS — F411 Generalized anxiety disorder: Secondary | ICD-10-CM | POA: Diagnosis not present

## 2014-08-01 DIAGNOSIS — F329 Major depressive disorder, single episode, unspecified: Secondary | ICD-10-CM | POA: Diagnosis not present

## 2014-08-01 DIAGNOSIS — I10 Essential (primary) hypertension: Secondary | ICD-10-CM | POA: Diagnosis not present

## 2014-08-02 DIAGNOSIS — F411 Generalized anxiety disorder: Secondary | ICD-10-CM | POA: Diagnosis not present

## 2014-08-02 DIAGNOSIS — Z792 Long term (current) use of antibiotics: Secondary | ICD-10-CM | POA: Diagnosis not present

## 2014-08-02 DIAGNOSIS — R262 Difficulty in walking, not elsewhere classified: Secondary | ICD-10-CM | POA: Diagnosis not present

## 2014-08-02 DIAGNOSIS — F3289 Other specified depressive episodes: Secondary | ICD-10-CM | POA: Diagnosis not present

## 2014-08-02 DIAGNOSIS — IMO0001 Reserved for inherently not codable concepts without codable children: Secondary | ICD-10-CM | POA: Diagnosis not present

## 2014-08-02 DIAGNOSIS — I10 Essential (primary) hypertension: Secondary | ICD-10-CM | POA: Diagnosis not present

## 2014-08-02 DIAGNOSIS — F329 Major depressive disorder, single episode, unspecified: Secondary | ICD-10-CM | POA: Diagnosis not present

## 2014-08-03 DIAGNOSIS — F329 Major depressive disorder, single episode, unspecified: Secondary | ICD-10-CM | POA: Diagnosis not present

## 2014-08-03 DIAGNOSIS — IMO0001 Reserved for inherently not codable concepts without codable children: Secondary | ICD-10-CM | POA: Diagnosis not present

## 2014-08-03 DIAGNOSIS — F3289 Other specified depressive episodes: Secondary | ICD-10-CM | POA: Diagnosis not present

## 2014-08-03 DIAGNOSIS — F411 Generalized anxiety disorder: Secondary | ICD-10-CM | POA: Diagnosis not present

## 2014-08-03 DIAGNOSIS — Z792 Long term (current) use of antibiotics: Secondary | ICD-10-CM | POA: Diagnosis not present

## 2014-08-03 DIAGNOSIS — R262 Difficulty in walking, not elsewhere classified: Secondary | ICD-10-CM | POA: Diagnosis not present

## 2014-08-03 DIAGNOSIS — I10 Essential (primary) hypertension: Secondary | ICD-10-CM | POA: Diagnosis not present

## 2014-08-06 DIAGNOSIS — F329 Major depressive disorder, single episode, unspecified: Secondary | ICD-10-CM | POA: Diagnosis not present

## 2014-08-06 DIAGNOSIS — IMO0001 Reserved for inherently not codable concepts without codable children: Secondary | ICD-10-CM | POA: Diagnosis not present

## 2014-08-06 DIAGNOSIS — R262 Difficulty in walking, not elsewhere classified: Secondary | ICD-10-CM | POA: Diagnosis not present

## 2014-08-06 DIAGNOSIS — Z792 Long term (current) use of antibiotics: Secondary | ICD-10-CM | POA: Diagnosis not present

## 2014-08-06 DIAGNOSIS — F3289 Other specified depressive episodes: Secondary | ICD-10-CM | POA: Diagnosis not present

## 2014-08-06 DIAGNOSIS — I10 Essential (primary) hypertension: Secondary | ICD-10-CM | POA: Diagnosis not present

## 2014-08-06 DIAGNOSIS — F411 Generalized anxiety disorder: Secondary | ICD-10-CM | POA: Diagnosis not present

## 2014-08-07 DIAGNOSIS — F329 Major depressive disorder, single episode, unspecified: Secondary | ICD-10-CM | POA: Diagnosis not present

## 2014-08-07 DIAGNOSIS — F3289 Other specified depressive episodes: Secondary | ICD-10-CM | POA: Diagnosis not present

## 2014-08-07 DIAGNOSIS — I10 Essential (primary) hypertension: Secondary | ICD-10-CM | POA: Diagnosis not present

## 2014-08-07 DIAGNOSIS — IMO0001 Reserved for inherently not codable concepts without codable children: Secondary | ICD-10-CM | POA: Diagnosis not present

## 2014-08-07 DIAGNOSIS — F411 Generalized anxiety disorder: Secondary | ICD-10-CM | POA: Diagnosis not present

## 2014-08-07 DIAGNOSIS — R262 Difficulty in walking, not elsewhere classified: Secondary | ICD-10-CM | POA: Diagnosis not present

## 2014-08-07 DIAGNOSIS — Z792 Long term (current) use of antibiotics: Secondary | ICD-10-CM | POA: Diagnosis not present

## 2014-08-08 DIAGNOSIS — F329 Major depressive disorder, single episode, unspecified: Secondary | ICD-10-CM | POA: Diagnosis not present

## 2014-08-08 DIAGNOSIS — Z792 Long term (current) use of antibiotics: Secondary | ICD-10-CM | POA: Diagnosis not present

## 2014-08-08 DIAGNOSIS — F411 Generalized anxiety disorder: Secondary | ICD-10-CM | POA: Diagnosis not present

## 2014-08-08 DIAGNOSIS — IMO0001 Reserved for inherently not codable concepts without codable children: Secondary | ICD-10-CM | POA: Diagnosis not present

## 2014-08-08 DIAGNOSIS — F3289 Other specified depressive episodes: Secondary | ICD-10-CM | POA: Diagnosis not present

## 2014-08-08 DIAGNOSIS — R262 Difficulty in walking, not elsewhere classified: Secondary | ICD-10-CM | POA: Diagnosis not present

## 2014-08-08 DIAGNOSIS — I10 Essential (primary) hypertension: Secondary | ICD-10-CM | POA: Diagnosis not present

## 2014-09-06 ENCOUNTER — Ambulatory Visit
Admission: RE | Admit: 2014-09-06 | Discharge: 2014-09-06 | Disposition: A | Discharge status: Home / Self Care | Payer: Medicare Other | Source: Ambulatory Visit | Attending: Internal Medicine | Admitting: Internal Medicine

## 2014-09-06 DIAGNOSIS — K769 Liver disease, unspecified: Secondary | ICD-10-CM

## 2014-09-07 ENCOUNTER — Other Ambulatory Visit: Payer: Self-pay | Admitting: Internal Medicine

## 2014-09-07 DIAGNOSIS — R935 Abnormal findings on diagnostic imaging of other abdominal regions, including retroperitoneum: Secondary | ICD-10-CM

## 2014-09-17 ENCOUNTER — Ambulatory Visit (HOSPITAL_COMMUNITY)
Admission: RE | Admit: 2014-09-17 | Discharge: 2014-09-17 | Disposition: A | Discharge status: Home / Self Care | Payer: Medicare Other | Source: Ambulatory Visit | Attending: Surgery | Admitting: Surgery

## 2014-09-17 ENCOUNTER — Ambulatory Visit
Admission: RE | Admit: 2014-09-17 | Discharge: 2014-09-17 | Disposition: A | Discharge status: Home / Self Care | Payer: Medicare Other | Source: Ambulatory Visit | Attending: Internal Medicine | Admitting: Internal Medicine

## 2014-09-17 ENCOUNTER — Encounter: Payer: Medicare Other | Admitting: Family

## 2014-09-17 DIAGNOSIS — I6529 Occlusion and stenosis of unspecified carotid artery: Secondary | ICD-10-CM

## 2014-09-17 DIAGNOSIS — Z48812 Encounter for surgical aftercare following surgery on the circulatory system: Secondary | ICD-10-CM | POA: Diagnosis not present

## 2014-09-17 DIAGNOSIS — R935 Abnormal findings on diagnostic imaging of other abdominal regions, including retroperitoneum: Secondary | ICD-10-CM

## 2014-09-17 MED ORDER — GADOBENATE DIMEGLUMINE 529 MG/ML IV SOLN
14.0000 mL | Freq: Once | INTRAVENOUS | Status: AC | PRN
  Administered 2014-09-17: 14 mL via INTRAVENOUS

## 2014-09-19 ENCOUNTER — Other Ambulatory Visit: Payer: Self-pay

## 2014-09-19 DIAGNOSIS — Z48812 Encounter for surgical aftercare following surgery on the circulatory system: Secondary | ICD-10-CM

## 2014-09-19 DIAGNOSIS — I6523 Occlusion and stenosis of bilateral carotid arteries: Secondary | ICD-10-CM

## 2014-09-19 NOTE — Patient Instructions (Signed)
Dear  Ms. Sabra Heck, Your recent Vascular Lab visit 09-17-14 indicates: No significant change  In comparison to the last exam on 07-10-13. Please follow up with [place] in One Year !          Stroke Prevention Some medical conditions and behaviors are associated with an increased chance of having a stroke. You may prevent a stroke by making healthy choices and managing medical conditions. HOW CAN I REDUCE MY RISK OF HAVING A STROKE?   Stay physically active. Get at least 30 minutes of activity on most or all days.  Do not smoke. It may also be helpful to avoid exposure to secondhand smoke.  Limit alcohol use. Moderate alcohol use is considered to be:  No more than 2 drinks per day for men.  No more than 1 drink per day for nonpregnant women.  Eat healthy foods. This involves:  Eating 5 or more servings of fruits and vegetables a day.  Making dietary changes that address high blood pressure (hypertension), high cholesterol, diabetes, or obesity.  Manage your cholesterol levels.  Making food choices that are high in fiber and low in saturated fat, trans fat, and cholesterol may control cholesterol levels.  Take any prescribed medicines to control cholesterol as directed by your health care provider.  Manage your diabetes.  Controlling your carbohydrate and sugar intake is recommended to manage diabetes.  Take any prescribed medicines to control diabetes as directed by your health care provider.  Control your hypertension.  Making food choices that are low in salt (sodium), saturated fat, trans fat, and cholesterol is recommended to manage hypertension.  Take any prescribed medicines to control hypertension as directed by your health care provider.  Maintain a healthy weight.  Reducing calorie intake and making food choices that are low in sodium, saturated fat, trans fat, and cholesterol are recommended to manage weight.  Stop drug abuse.  Avoid taking birth control  pills.  Talk to your health care provider about the risks of taking birth control pills if you are over 59 years old, smoke, get migraines, or have ever had a blood clot.  Get evaluated for sleep disorders (sleep apnea).  Talk to your health care provider about getting a sleep evaluation if you snore a lot or have excessive sleepiness.  Take medicines only as directed by your health care provider.  For some people, aspirin or blood thinners (anticoagulants) are helpful in reducing the risk of forming abnormal blood clots that can lead to stroke. If you have the irregular heart rhythm of atrial fibrillation, you should be on a blood thinner unless there is a good reason you cannot take them.  Understand all your medicine instructions.  Make sure that other conditions (such as anemia or atherosclerosis) are addressed. SEEK IMMEDIATE MEDICAL CARE IF:   You have sudden weakness or numbness of the face, arm, or leg, especially on one side of the body.  Your face or eyelid droops to one side.  You have sudden confusion.  You have trouble speaking (aphasia) or understanding.  You have sudden trouble seeing in one or both eyes.  You have sudden trouble walking.  You have dizziness.  You have a loss of balance or coordination.  You have a sudden, severe headache with no known cause.  You have new chest pain or an irregular heartbeat. Any of these symptoms may represent a serious problem that is an emergency. Do not wait to see if the symptoms will go away. Get medical help  at once. Call your local emergency services (911 in [place] Do not drive yourself to the hospital. Document Released: 01/07/2005 Document Revised: 04/16/2014 Document Reviewed: 06/02/2013 [facility] Patient Information 2015 [place], [place]. This information is not intended to replace advice given to you by your health care provider. Make sure you discuss any questions you have with your health care provider.

## 2014-09-20 ENCOUNTER — Encounter: Payer: Self-pay | Admitting: Surgery

## 2014-09-20 NOTE — Progress Notes (Signed)
Lab only 

## 2014-11-05 ENCOUNTER — Other Ambulatory Visit: Payer: Self-pay | Admitting: Dermatology

## 2015-09-23 ENCOUNTER — Encounter (HOSPITAL_COMMUNITY): Payer: Self-pay

## 2015-09-23 ENCOUNTER — Ambulatory Visit: Payer: Medicare Other | Admitting: Family

## 2015-12-20 ENCOUNTER — Encounter: Payer: Self-pay | Admitting: Family

## 2015-12-30 ENCOUNTER — Ambulatory Visit: Payer: Self-pay | Admitting: Family

## 2015-12-30 ENCOUNTER — Encounter (HOSPITAL_COMMUNITY): Payer: Self-pay

## 2016-01-28 ENCOUNTER — Encounter: Payer: Self-pay | Admitting: Family

## 2016-02-03 ENCOUNTER — Ambulatory Visit (INDEPENDENT_AMBULATORY_CARE_PROVIDER_SITE_OTHER): Payer: Medicare Other | Admitting: Family

## 2016-02-03 ENCOUNTER — Ambulatory Visit (HOSPITAL_COMMUNITY)
Admission: RE | Admit: 2016-02-03 | Discharge: 2016-02-03 | Disposition: A | Discharge status: Home / Self Care | Payer: Medicare Other | Source: Ambulatory Visit | Attending: Family | Admitting: Family

## 2016-02-03 ENCOUNTER — Encounter: Payer: Self-pay | Admitting: Family

## 2016-02-03 VITALS — BP 181/84 | HR 58 | Ht 63.0 in | Wt 188.0 lb

## 2016-02-03 DIAGNOSIS — I6523 Occlusion and stenosis of bilateral carotid arteries: Secondary | ICD-10-CM | POA: Diagnosis not present

## 2016-02-03 DIAGNOSIS — Z48812 Encounter for surgical aftercare following surgery on the circulatory system: Secondary | ICD-10-CM | POA: Insufficient documentation

## 2016-02-03 DIAGNOSIS — Z9889 Other specified postprocedural states: Secondary | ICD-10-CM

## 2016-02-03 DIAGNOSIS — Z95820 Peripheral vascular angioplasty status with implants and grafts: Secondary | ICD-10-CM | POA: Diagnosis not present

## 2016-02-03 NOTE — Patient Instructions (Signed)
Stroke Prevention Some medical conditions and behaviors are associated with an increased chance of having a stroke. You may prevent a stroke by making healthy choices and managing medical conditions. HOW CAN I REDUCE MY RISK OF HAVING A STROKE?   Stay physically active. Get at least 30 minutes of activity on most or all days.  Do not smoke. It may also be helpful to avoid exposure to secondhand smoke.  Limit alcohol use. Moderate alcohol use is considered to be:  No more than 2 drinks per day for men.  No more than 1 drink per day for nonpregnant women.  Eat healthy foods. This involves:  Eating 5 or more servings of fruits and vegetables a day.  Making dietary changes that address high blood pressure (hypertension), high cholesterol, diabetes, or obesity.  Manage your cholesterol levels.  Making food choices that are high in fiber and low in saturated fat, trans fat, and cholesterol may control cholesterol levels.  Take any prescribed medicines to control cholesterol as directed by your health care provider.  Manage your diabetes.  Controlling your carbohydrate and sugar intake is recommended to manage diabetes.  Take any prescribed medicines to control diabetes as directed by your health care provider.  Control your hypertension.  Making food choices that are low in salt (sodium), saturated fat, trans fat, and cholesterol is recommended to manage hypertension.  Ask your health care provider if you need treatment to lower your blood pressure. Take any prescribed medicines to control hypertension as directed by your health care provider.  If you are 18-39 years of age, have your blood pressure checked every 3-5 years. If you are 40 years of age or older, have your blood pressure checked every year.  Maintain a healthy weight.  Reducing calorie intake and making food choices that are low in sodium, saturated fat, trans fat, and cholesterol are recommended to manage  weight.  Stop drug abuse.  Avoid taking birth control pills.  Talk to your health care provider about the risks of taking birth control pills if you are over 35 years old, smoke, get migraines, or have ever had a blood clot.  Get evaluated for sleep disorders (sleep apnea).  Talk to your health care provider about getting a sleep evaluation if you snore a lot or have excessive sleepiness.  Take medicines only as directed by your health care provider.  For some people, aspirin or blood thinners (anticoagulants) are helpful in reducing the risk of forming abnormal blood clots that can lead to stroke. If you have the irregular heart rhythm of atrial fibrillation, you should be on a blood thinner unless there is a good reason you cannot take them.  Understand all your medicine instructions.  Make sure that other conditions (such as anemia or atherosclerosis) are addressed. SEEK IMMEDIATE MEDICAL CARE IF:   You have sudden weakness or numbness of the face, arm, or leg, especially on one side of the body.  Your face or eyelid droops to one side.  You have sudden confusion.  You have trouble speaking (aphasia) or understanding.  You have sudden trouble seeing in one or both eyes.  You have sudden trouble walking.  You have dizziness.  You have a loss of balance or coordination.  You have a sudden, severe headache with no known cause.  You have new chest pain or an irregular heartbeat. Any of these symptoms may represent a serious problem that is an emergency. Do not wait to see if the symptoms will   go away. Get medical help at once. Call your local emergency services (911 in [place] Do not drive yourself to the hospital.   This information is not intended to replace advice given to you by your health care provider. Make sure you discuss any questions you have with your health care provider.   Document Released: 01/07/2005 Document Revised: 12/21/2014 Document Reviewed:  06/02/2013 [employer] Interactive Patient Education 2016 [employer]  

## 2016-02-03 NOTE — Progress Notes (Signed)
Chief Complaint: Extracranial Carotid Artery Stenosis   History of Present Illness  Michaela Flynn is a 82 y.o. female patient of Dr. Trula Slade who is status post right carotid endarterectomy on 12/23/2012. This was done for possible symptomatic stenosis as the patient had a few falls and possible slurred speech. Intraoperative findings included a redundant carotid artery with resection and primary anastomosis of the internal carotid artery. She had 80% stenosis. She has no complaints today and is neurologically without issue. No further episodes of slurred speech. She has tried to start a statin but cannot afford the one she was started on. She has a history of taking a statin in the past and had elevated liver enzymes.  Daughter states her mother's blood pressure at home is normally 138-144/70's. Pt lives in an apt with a personal care attendant that helps her several times/week.  The patient's daughter New Medical or Surgical History: daughter attributes her mother's weight gain to Lyrica that she takes for post herpetic pain and chronic back pain.   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 Hyperlipidemia   . Hypertension   . Anxiety and depression   . Glaucoma   . Abnormal LFTs     secondary to cholesterol  medication  . Fibrocystic breast disease   . Diverticulitis   . Shingles 2010  . S/P tonsillectomy and adenoidectomy 1975  . Cancer 1972    Nonmelanoma resection  . Arthritis     Lower back," Osteo"  . Carotid artery occlusion     Bruit  . PONV (postoperative nausea and vomiting)     Social History Social History  Substance Use Topics  . Smoking status: Never Smoker   . Smokeless tobacco: Never Used  . Alcohol Use: No     Family History Family History  Problem Relation Age of Onset  . Stroke Father   . Heart attack Father     Surgical History Past Surgical History  Procedure Laterality Date  . Cholecystectomy  1956  . Eye surgery      Cataract  . Appendectomy  1956  . Colonoscopy  2008    [facility] in [place]  . Esophagogastroduodenoscopy      ?  Zenker's per the patient  . Toe surgery      Onychomycosis  . Tonsillectomy and adenoidectomy    . Abdominal hysterectomy  1980's  . Endarterectomy  12/23/2012    Procedure: ENDARTERECTOMY CAROTID;  Surgeon: Serafina Mitchell, MD;  Location: [facility];  Service: Vascular;  Laterality: Right;  . Patch angioplasty  12/23/2012    Procedure: PATCH ANGIOPLASTY;  Surgeon: Serafina Mitchell, MD;  Location: [facility];  Service: Vascular;  Laterality: Right;  Using 1 cm x 6 cm vascu guard patch  . Carotid endarterectomy      No Known Allergies  Current Outpatient Prescriptions  Medication Sig Dispense Refill  . aspirin 325 MG tablet Take 325 mg by mouth daily.    . benazepril-hydrochlorthiazide (LOTENSIN HCT) 20-12.5 MG per tablet Take 1 tablet by mouth daily.    . clonazePAM (KLONOPIN) 0.5 MG tablet Take 0.5 mg by mouth 2 (two) times daily as needed. For anxiety    . escitalopram (LEXAPRO) 10 MG tablet Take 5 mg by  mouth daily.    Marland Kitchen loratadine (CLARITIN) 10 MG tablet Take 10 mg by mouth daily as needed for allergies.    . metoprolol tartrate (LOPRESSOR) 25 MG tablet Take 25 mg by mouth 2 (two) times daily.    Vladimir Faster Glycol-Propyl Glycol (SYSTANE PRESERVATIVE FREE) 0.4-0.3 % SOLN Place 1 drop into both eyes daily as needed. For dry eyes    . traMADol (ULTRAM) 50 MG tablet Take 1 tablet (50 mg total) by mouth every 6 (six) hours as needed. For pain 30 tablet 0  . travoprost, benzalkonium, (TRAVATAN) 0.004 % ophthalmic solution Place 1 drop into both eyes at bedtime.      No current facility-administered medications for this visit.    Review of Systems : See  HPI for pertinent positives and negatives.  Physical Examination  Filed Vitals:   02/03/16 1231 02/03/16 1233  BP: 173/82 181/84  Pulse: 58   Height: 5\' 3"  (1.6 m)   Weight: 188 lb (85.276 kg)   SpO2: 96%    Body mass index is 33.31 kg/(m^2).  General: WDWN obese female in NAD GAIT: slow, deliberate, using walker Eyes: PERRLA Pulmonary:  Non-labored, CTAB  Cardiac: regular rhythm,  no detected murmur.  VASCULAR EXAM Carotid Bruits Right Left   Negative Negative    Aorta is not palpable. Radial pulses are 2+ palpable and equal.                                                                                                                            LE Pulses Right Left       POPLITEAL  not palpable   not palpable       POSTERIOR TIBIAL   palpable    palpable        DORSALIS PEDIS      ANTERIOR TIBIAL not palpable  not palpable     Gastrointestinal: soft, nontender, BS WNL, no r/g,  no palpable masses.  Musculoskeletal: generalized deconditioning muscle atrophy/wasting. M/S 4/5 throughout, extremities without ischemic changes.  Neurologic: A&O X 3; Appropriate Affect, Speech is normal CN 2-12 intact except is hard of hearing, pain and light touch intact in extremities, Motor exam as listed above.   Non-Invasive Vascular Imaging CAROTID DUPLEX 02/03/2016   CEREBROVASCULAR DUPLEX EVALUATION    INDICATION: Carotid artery disease    PREVIOUS INTERVENTION(S): Right carotid endarterectomy 12/23/2012 with bovine patch angioplasty and resection of primary anastomosis of the right internal carotid artery by Dr. Trula Slade    DUPLEX EXAM: Carotid duplex    RIGHT  LEFT  Peak Systolic Velocities (cm/s) End Diastolic Velocities (cm/s) Plaque LOCATION Peak Systolic Velocities (cm/s) End Diastolic Velocities (cm/s) Plaque  55 14 HM CCA PROXIMAL 75 13   52 12  CCA MID 59 13 HT  59 12  CCA DISTAL 53 17   83 7  ECA 124 20   55 15 HT ICA PROXIMAL 86 24   64 19  ICA MID 82  26    79 24  ICA DISTAL 53 18     N/A ICA / CCA Ratio (PSV) 1.4  Antegrade Vertebral Flow Antegrade  - Brachial Systolic Pressure (mmHg) -  Triphasic Brachial Artery Waveforms Biphasic    Plaque Morphology:  HM = Homogeneous, HT = Heterogeneous, CP = Calcific Plaque, SP = Smooth Plaque, IP = Irregular Plaque  ADDITIONAL FINDINGS: Subclavian arteries not well visualized    IMPRESSION: 1. Patent right carotid endarterectomy site with no evidence for restenosis 2. Less than 40% left internal carotid artery stenosis 3. Technically difficult exam due to depth of arteries    Compared to the previous exam:  No change since exam of 09/17/2014      Assessment: Michaela Flynn is a 82 y.o. female who is status post right carotid endarterectomy on 12/23/2012. This was done for possible symptomatic stenosis as the patient had a few falls and possible slurred speech.  Daughter indicates that pt has not had any further neurological events.   Today's carotid duplex suggests right carotid endarterectomy site with no evidence for restenosis Less than 40% left internal carotid artery stenosis Technically difficult exam due to depth of arteries. No change since exam of 09/17/2014.   Plan: Follow-up in 1 year with Carotid Duplex scan.   I discussed in depth with the patient the nature of atherosclerosis, and emphasized the importance of maximal medical management including strict control of blood pressure, blood glucose, and lipid levels, obtaining regular exercise, and continued cessation of smoking.  The patient is aware that without maximal medical management the underlying atherosclerotic disease process will progress, limiting the benefit of any interventions. The patient was given information about stroke prevention and what symptoms should prompt the patient to seek immediate medical care. Thank you for allowing [place] to participate in this patient's care.  Clemon Chambers, RN, MSN, FNP-C [facility] Office: [phone]  Clinic Physician: Kellie Simmering  02/03/2016 12:10 PM

## 2016-03-23 ENCOUNTER — Encounter (HOSPITAL_COMMUNITY): Payer: Self-pay | Admitting: Emergency Medicine

## 2016-03-23 ENCOUNTER — Inpatient Hospital Stay (HOSPITAL_COMMUNITY)
Admission: EM | Admit: 2016-03-23 | Discharge: 2016-03-27 | DRG: 871 | Disposition: A | Discharge status: Skilled Nursing Facility | Payer: Medicare Other | Attending: Internal Medicine | Admitting: Internal Medicine

## 2016-03-23 ENCOUNTER — Emergency Department (HOSPITAL_COMMUNITY): Payer: Medicare Other

## 2016-03-23 DIAGNOSIS — F419 Anxiety disorder, unspecified: Secondary | ICD-10-CM | POA: Diagnosis present

## 2016-03-23 DIAGNOSIS — R609 Edema, unspecified: Secondary | ICD-10-CM | POA: Diagnosis present

## 2016-03-23 DIAGNOSIS — E876 Hypokalemia: Secondary | ICD-10-CM | POA: Diagnosis present

## 2016-03-23 DIAGNOSIS — A419 Sepsis, unspecified organism: Principal | ICD-10-CM | POA: Diagnosis present

## 2016-03-23 DIAGNOSIS — R079 Chest pain, unspecified: Secondary | ICD-10-CM | POA: Diagnosis not present

## 2016-03-23 DIAGNOSIS — E860 Dehydration: Secondary | ICD-10-CM | POA: Diagnosis present

## 2016-03-23 DIAGNOSIS — J441 Chronic obstructive pulmonary disease with (acute) exacerbation: Secondary | ICD-10-CM | POA: Diagnosis present

## 2016-03-23 DIAGNOSIS — E785 Hyperlipidemia, unspecified: Secondary | ICD-10-CM | POA: Diagnosis present

## 2016-03-23 DIAGNOSIS — Z90710 Acquired absence of both cervix and uterus: Secondary | ICD-10-CM | POA: Diagnosis not present

## 2016-03-23 DIAGNOSIS — N390 Urinary tract infection, site not specified: Secondary | ICD-10-CM | POA: Diagnosis present

## 2016-03-23 DIAGNOSIS — Z7982 Long term (current) use of aspirin: Secondary | ICD-10-CM | POA: Diagnosis not present

## 2016-03-23 DIAGNOSIS — M79606 Pain in leg, unspecified: Secondary | ICD-10-CM | POA: Diagnosis not present

## 2016-03-23 DIAGNOSIS — J440 Chronic obstructive pulmonary disease with acute lower respiratory infection: Secondary | ICD-10-CM | POA: Diagnosis present

## 2016-03-23 DIAGNOSIS — B9620 Unspecified Escherichia coli [E. coli] as the cause of diseases classified elsewhere: Secondary | ICD-10-CM | POA: Diagnosis present

## 2016-03-23 DIAGNOSIS — Z66 Do not resuscitate: Secondary | ICD-10-CM | POA: Diagnosis present

## 2016-03-23 DIAGNOSIS — Z8249 Family history of ischemic heart disease and other diseases of the circulatory system: Secondary | ICD-10-CM

## 2016-03-23 DIAGNOSIS — I10 Essential (primary) hypertension: Secondary | ICD-10-CM | POA: Diagnosis present

## 2016-03-23 DIAGNOSIS — J45901 Unspecified asthma with (acute) exacerbation: Secondary | ICD-10-CM | POA: Diagnosis present

## 2016-03-23 DIAGNOSIS — J189 Pneumonia, unspecified organism: Secondary | ICD-10-CM | POA: Diagnosis present

## 2016-03-23 DIAGNOSIS — A4151 Sepsis due to Escherichia coli [E. coli]: Secondary | ICD-10-CM | POA: Diagnosis not present

## 2016-03-23 DIAGNOSIS — F329 Major depressive disorder, single episode, unspecified: Secondary | ICD-10-CM | POA: Diagnosis present

## 2016-03-23 DIAGNOSIS — R0600 Dyspnea, unspecified: Secondary | ICD-10-CM

## 2016-03-23 DIAGNOSIS — K219 Gastro-esophageal reflux disease without esophagitis: Secondary | ICD-10-CM | POA: Diagnosis present

## 2016-03-23 DIAGNOSIS — R05 Cough: Secondary | ICD-10-CM | POA: Diagnosis not present

## 2016-03-23 LAB — CBC WITH DIFFERENTIAL/PLATELET
BASOS ABS: 0 10*3/uL (ref 0.0–0.1)
Basophils Relative: 0 %
Eosinophils Absolute: 0.1 10*3/uL (ref 0.0–0.7)
Eosinophils Relative: 1 %
HEMATOCRIT: 46 % (ref 36.0–46.0)
Hemoglobin: 15.1 g/dL — ABNORMAL HIGH (ref 12.0–15.0)
LYMPHS ABS: 0.7 10*3/uL (ref 0.7–4.0)
LYMPHS PCT: 9 %
MCH: 28.6 pg (ref 26.0–34.0)
MCHC: 32.8 g/dL (ref 30.0–36.0)
MCV: 87.1 fL (ref 78.0–100.0)
Monocytes Absolute: 0.2 10*3/uL (ref 0.1–1.0)
Monocytes Relative: 2 %
NEUTROS ABS: 6.1 10*3/uL (ref 1.7–7.7)
NEUTROS PCT: 88 %
PLATELETS: 202 10*3/uL (ref 150–400)
RBC: 5.28 MIL/uL — ABNORMAL HIGH (ref 3.87–5.11)
RDW: 13 % (ref 11.5–15.5)
WBC: 7 10*3/uL (ref 4.0–10.5)

## 2016-03-23 LAB — I-STAT CG4 LACTIC ACID, ED: LACTIC ACID, VENOUS: 2.16 mmol/L — AB (ref 0.5–2.0)

## 2016-03-23 LAB — BRAIN NATRIURETIC PEPTIDE: B NATRIURETIC PEPTIDE 5: 39.7 pg/mL (ref 0.0–100.0)

## 2016-03-23 LAB — STREP PNEUMONIAE URINARY ANTIGEN: STREP PNEUMO URINARY ANTIGEN: NEGATIVE

## 2016-03-23 LAB — BASIC METABOLIC PANEL
ANION GAP: 11 (ref 5–15)
BUN: 28 mg/dL — ABNORMAL HIGH (ref 6–20)
CALCIUM: 9.1 mg/dL (ref 8.9–10.3)
CO2: 27 mmol/L (ref 22–32)
CREATININE: 1.08 mg/dL — AB (ref 0.44–1.00)
Chloride: 104 mmol/L (ref 101–111)
GFR, EST AFRICAN AMERICAN: 52 mL/min — AB (ref >60)
GFR, EST NON AFRICAN AMERICAN: 44 mL/min — AB (ref >60)
Glucose, Bld: 135 mg/dL — ABNORMAL HIGH (ref 65–99)
Potassium: 3.2 mmol/L — ABNORMAL LOW (ref 3.5–5.1)
SODIUM: 142 mmol/L (ref 135–145)

## 2016-03-23 LAB — URINALYSIS, ROUTINE W REFLEX MICROSCOPIC
Bilirubin Urine: NEGATIVE
Glucose, UA: NEGATIVE mg/dL
Ketones, ur: NEGATIVE mg/dL
NITRITE: POSITIVE — AB
PROTEIN: NEGATIVE mg/dL
SPECIFIC GRAVITY, URINE: 1.017 (ref 1.005–1.030)
pH: 5.5 (ref 5.0–8.0)

## 2016-03-23 LAB — INFLUENZA PANEL BY PCR (TYPE A & B)
H1N1 flu by pcr: NOT DETECTED
INFLAPCR: NEGATIVE
INFLBPCR: NEGATIVE

## 2016-03-23 LAB — URINE MICROSCOPIC-ADD ON

## 2016-03-23 LAB — TROPONIN I
Troponin I: 0.14 ng/mL — ABNORMAL HIGH (ref <0.031)
Troponin I: 0.12 ng/mL — ABNORMAL HIGH (ref <0.031)

## 2016-03-23 LAB — MAGNESIUM: MAGNESIUM: 1.5 mg/dL — AB (ref 1.7–2.4)

## 2016-03-23 LAB — LACTIC ACID, PLASMA
Lactic Acid, Venous: 1.9 mmol/L (ref 0.5–2.0)
Lactic Acid, Venous: 1.2 mmol/L (ref 0.5–2.0)

## 2016-03-23 MED ORDER — CHLORHEXIDINE GLUCONATE 0.12 % MT SOLN
15.0000 mL | Freq: Two times a day (BID) | OROMUCOSAL | Status: DC
  Administered 2016-03-23 – 2016-03-27 (×7): 15 mL via OROMUCOSAL
  Filled 2016-03-23 (×8): qty 15

## 2016-03-23 MED ORDER — SODIUM CHLORIDE 0.9 % IV SOLN: INTRAVENOUS | Status: DC

## 2016-03-23 MED ORDER — LATANOPROST 0.005 % OP SOLN
1.0000 [drp] | Freq: Every day | OPHTHALMIC | Status: DC
  Administered 2016-03-23 – 2016-03-26 (×4): 1 [drp] via OPHTHALMIC
  Filled 2016-03-23: qty 2.5

## 2016-03-23 MED ORDER — SODIUM CHLORIDE 0.9 % IV BOLUS (SEPSIS)
1000.0000 mL | INTRAVENOUS | Status: AC
  Administered 2016-03-23 (×2): 1000 mL via INTRAVENOUS

## 2016-03-23 MED ORDER — ACETAMINOPHEN 325 MG PO TABS
650.0000 mg | ORAL_TABLET | Freq: Four times a day (QID) | ORAL | Status: DC | PRN
  Administered 2016-03-27: 650 mg via ORAL
  Filled 2016-03-23: qty 2

## 2016-03-23 MED ORDER — POLYVINYL ALCOHOL 1.4 % OP SOLN
1.0000 [drp] | Freq: Four times a day (QID) | OPHTHALMIC | Status: DC | PRN
  Administered 2016-03-25: 1 [drp] via OPHTHALMIC
  Filled 2016-03-23: qty 15

## 2016-03-23 MED ORDER — ACETAMINOPHEN 325 MG PO TABS
650.0000 mg | ORAL_TABLET | Freq: Once | ORAL | Status: AC
  Administered 2016-03-23: 650 mg via ORAL
  Filled 2016-03-23: qty 2

## 2016-03-23 MED ORDER — DEXTROSE 5 % IV SOLN
500.0000 mg | INTRAVENOUS | Status: DC
  Administered 2016-03-24 – 2016-03-27 (×4): 500 mg via INTRAVENOUS
  Filled 2016-03-23 (×4): qty 500

## 2016-03-23 MED ORDER — ASPIRIN 325 MG PO TABS
325.0000 mg | ORAL_TABLET | Freq: Every day | ORAL | Status: DC
  Administered 2016-03-23 – 2016-03-27 (×5): 325 mg via ORAL
  Filled 2016-03-23 (×5): qty 1

## 2016-03-23 MED ORDER — DEXTROSE 5 % IV SOLN
500.0000 mg | Freq: Once | INTRAVENOUS | Status: AC
  Administered 2016-03-23: 500 mg via INTRAVENOUS
  Filled 2016-03-23: qty 500

## 2016-03-23 MED ORDER — DEXTROSE 5 % IV SOLN
1.0000 g | INTRAVENOUS | Status: DC
  Administered 2016-03-24 – 2016-03-27 (×4): 1 g via INTRAVENOUS
  Filled 2016-03-23 (×4): qty 10

## 2016-03-23 MED ORDER — MAGNESIUM SULFATE 2 GM/50ML IV SOLN
2.0000 g | Freq: Once | INTRAVENOUS | Status: AC
  Administered 2016-03-23: 2 g via INTRAVENOUS
  Filled 2016-03-23: qty 50

## 2016-03-23 MED ORDER — ENOXAPARIN SODIUM 40 MG/0.4ML ~~LOC~~ SOLN
40.0000 mg | SUBCUTANEOUS | Status: DC
  Administered 2016-03-23 – 2016-03-27 (×5): 40 mg via SUBCUTANEOUS
  Filled 2016-03-23 (×5): qty 0.4

## 2016-03-23 MED ORDER — TRAMADOL HCL 50 MG PO TABS
50.0000 mg | ORAL_TABLET | Freq: Four times a day (QID) | ORAL | Status: DC | PRN
  Administered 2016-03-23 – 2016-03-27 (×7): 50 mg via ORAL
  Filled 2016-03-23 (×8): qty 1

## 2016-03-23 MED ORDER — CLONAZEPAM 0.5 MG PO TABS
0.2500 mg | ORAL_TABLET | Freq: Every day | ORAL | Status: DC
Start: 2016-03-23 — End: 2016-03-27
  Administered 2016-03-23 – 2016-03-26 (×4): 0.25 mg via ORAL
  Filled 2016-03-23 (×4): qty 1

## 2016-03-23 MED ORDER — ONDANSETRON HCL 4 MG/2ML IJ SOLN: 4.0000 mg | Freq: Four times a day (QID) | INTRAMUSCULAR | Status: DC | PRN

## 2016-03-23 MED ORDER — SIMVASTATIN 10 MG PO TABS
10.0000 mg | ORAL_TABLET | ORAL | Status: DC
  Administered 2016-03-23 – 2016-03-27 (×3): 10 mg via ORAL
  Filled 2016-03-23 (×3): qty 1

## 2016-03-23 MED ORDER — DEXTROSE 5 % IV SOLN
1.0000 g | INTRAVENOUS | Status: DC
  Filled 2016-03-23: qty 10

## 2016-03-23 MED ORDER — DEXTROSE 5 % IV SOLN: 1.0000 g | INTRAVENOUS | Status: DC

## 2016-03-23 MED ORDER — DEXTROSE 5 % IV SOLN
1.0000 g | Freq: Once | INTRAVENOUS | Status: AC
  Administered 2016-03-23: 1 g via INTRAVENOUS
  Filled 2016-03-23: qty 10

## 2016-03-23 MED ORDER — CETYLPYRIDINIUM CHLORIDE 0.05 % MT LIQD
7.0000 mL | Freq: Two times a day (BID) | OROMUCOSAL | Status: DC
  Administered 2016-03-23 – 2016-03-27 (×5): 7 mL via OROMUCOSAL

## 2016-03-23 MED ORDER — ESCITALOPRAM OXALATE 10 MG PO TABS
5.0000 mg | ORAL_TABLET | Freq: Every day | ORAL | Status: DC
  Administered 2016-03-23 – 2016-03-27 (×5): 5 mg via ORAL
  Filled 2016-03-23 (×5): qty 1

## 2016-03-23 MED ORDER — POTASSIUM CHLORIDE IN NACL 40-0.9 MEQ/L-% IV SOLN
INTRAVENOUS | Status: DC
  Administered 2016-03-23 – 2016-03-24 (×4): 100 mL/h via INTRAVENOUS
  Filled 2016-03-23 (×4): qty 1000

## 2016-03-23 MED ORDER — SODIUM CHLORIDE 0.9 % IV BOLUS (SEPSIS)
500.0000 mL | INTRAVENOUS | Status: AC
  Administered 2016-03-23: 500 mL via INTRAVENOUS

## 2016-03-23 MED ORDER — AZITHROMYCIN 500 MG IV SOLR
500.0000 mg | INTRAVENOUS | Status: DC
  Filled 2016-03-23: qty 500

## 2016-03-23 MED ORDER — METOPROLOL TARTRATE 25 MG PO TABS
25.0000 mg | ORAL_TABLET | Freq: Two times a day (BID) | ORAL | Status: DC
  Administered 2016-03-23 – 2016-03-27 (×8): 25 mg via ORAL
  Filled 2016-03-23 (×10): qty 1

## 2016-03-23 MED ORDER — PREDNISONE 50 MG PO TABS
50.0000 mg | ORAL_TABLET | Freq: Every day | ORAL | Status: DC
  Administered 2016-03-23 – 2016-03-27 (×5): 50 mg via ORAL
  Filled 2016-03-23 (×5): qty 1

## 2016-03-23 MED ORDER — TRAVOPROST 0.004 % OP SOLN
1.0000 [drp] | Freq: Every day | OPHTHALMIC | Status: DC
  Filled 2016-03-23: qty 5

## 2016-03-23 MED ORDER — DEXTROSE 5 % IV SOLN: 500.0000 mg | INTRAVENOUS | Status: DC

## 2016-03-23 MED ORDER — GABAPENTIN 100 MG PO CAPS
100.0000 mg | ORAL_CAPSULE | Freq: Three times a day (TID) | ORAL | Status: DC
  Administered 2016-03-23 – 2016-03-27 (×11): 100 mg via ORAL
  Filled 2016-03-23 (×11): qty 1

## 2016-03-23 NOTE — ED Notes (Signed)
Informed Dr. Dina Rich of lactic acid of 2.16.

## 2016-03-23 NOTE — ED Notes (Signed)
Patient was put on bedpan and there were not any results.

## 2016-03-23 NOTE — ED Notes (Signed)
Bed: [id number] Expected date:  Expected time:  Means of arrival:  Comments: EMS 76F SOB, tremors

## 2016-03-23 NOTE — Progress Notes (Signed)
PHARMACY NOTE -  Ceftriaxone & Azithromycin  Pharmacy has been assisting with dosing of Ceftriaxone & Azithromycin for CAP. Dosage remains stable at Ceftriaxone 1gm IV q24h & Azithromycin 500mg  IV q24h and need for further dosage adjustment appears unlikely at present.    Will sign off at this time.  Please reconsult if a change in clinical status warrants re-evaluation of dosage.  Leone Haven, PharmD

## 2016-03-23 NOTE — ED Notes (Signed)
Patient called EMS due to shortness of breath. Patients states that it hard to hear out of her ears.

## 2016-03-23 NOTE — Progress Notes (Signed)
This is a no charge note  Pending admission per Dr. Dina Rich  82 year old lady with past medical history of hypertension, hyperlipidemia, diverticulitis, GERD restenosis, who presents with cough, fever and shortness of breath. Found to have right middle lobe infiltration by chest x-ray. Urinalysis positive, but is dirty catch. WBC 7.0, lactate of 2.16, temperature well 1.6, tachycardia, tachypnea, potassium 3.2,   creatinine 1.08. Pt is accepted to tele bed as inpt status. IV abx started. IVF on going.   Ivor Costa, MD  [facility] Pager [phone]  If 7PM-7AM, please contact night-coverage [email or web address] Password [facility] 03/23/2016, 6:49 AM

## 2016-03-23 NOTE — ED Provider Notes (Signed)
CSN: [id number]     Arrival date & time 03/23/16  0445 History   None    Chief Complaint  Patient presents with  . Cough     (Consider location/radiation/quality/duration/timing/severity/associated sxs/prior Treatment) HPI  This is an 82 year old female with history of hypertension, hyperlipidemia who presents with shortness of breath and cough. Patient reports rather acute onset of worsening shortness of breath this morning when she was lying in bed. Recent history of nonproductive cough and "allergies." She denies any chest pain. No history of CHF or COPD. She's not on home oxygen. Denies any fevers.Denies any new lower extremity swelling. Does report tremulousness that is new for her.  Past Medical History  Diagnosis Date  . Hyperlipidemia   . Hypertension   . Anxiety and depression   . Glaucoma   . Abnormal LFTs     secondary to cholesterol  medication  . Fibrocystic breast disease   . Diverticulitis   . Shingles 2010  . S/P tonsillectomy and adenoidectomy 1975  . Cancer [facility]) 1972    Nonmelanoma resection  . Arthritis     Lower back," Osteo"  . Carotid artery occlusion     Bruit  . PONV (postoperative nausea and vomiting)    Past Surgical History  Procedure Laterality Date  . Cholecystectomy  1956  . Eye surgery      Cataract  . Appendectomy  1956  . Colonoscopy  2008    [facility] in [place]  . Esophagogastroduodenoscopy      ?  Zenker's per the patient  . Toe surgery      Onychomycosis  . Tonsillectomy and adenoidectomy    . Abdominal hysterectomy  1980's  . Endarterectomy  12/23/2012    Procedure: ENDARTERECTOMY CAROTID;  Surgeon: Serafina Mitchell, MD;  Location: [facility];  Service: Vascular;  Laterality: Right;  . Patch angioplasty  12/23/2012    Procedure: PATCH ANGIOPLASTY;  Surgeon: Serafina Mitchell, MD;  Location: [facility];  Service: Vascular;  Laterality: Right;  Using 1 cm x 6 cm vascu guard patch  . Carotid endarterectomy     Family History  Problem Relation  Age of Onset  . Stroke Father   . Heart attack Father    Social History  Substance Use Topics  . Smoking status: Never Smoker   . Smokeless tobacco: Never Used  . Alcohol Use: No   OB History    No data available     Review of Systems  Constitutional: Positive for fever.  Respiratory: Positive for cough and shortness of breath.   Cardiovascular: Negative for chest pain and leg swelling.  Gastrointestinal: Negative for nausea, vomiting and abdominal pain.  Genitourinary: Negative for difficulty urinating.  Neurological: Positive for tremors. Negative for headaches.  All other systems reviewed and are negative.     Allergies  Review of patient's allergies indicates no known allergies.  Home Medications   Prior to Admission medications   Medication Sig Start Date End Date Taking? Authorizing Provider  aspirin 325 MG tablet Take 325 mg by mouth daily.    Historical Provider, MD  benazepril-hydrochlorthiazide (LOTENSIN HCT) 20-12.5 MG per tablet Take 1 tablet by mouth daily.    Historical Provider, MD  clonazePAM (KLONOPIN) 0.5 MG tablet Take 0.5 mg by mouth 2 (two) times daily as needed. For anxiety    Historical Provider, MD  escitalopram (LEXAPRO) 10 MG tablet Take 5 mg by mouth daily.    Historical Provider, MD  gabapentin (NEURONTIN) 100 MG capsule  02/03/16   Historical Provider, MD  loratadine (CLARITIN) 10 MG tablet Take 10 mg by mouth daily as needed for allergies.    Historical Provider, MD  metoprolol tartrate (LOPRESSOR) 25 MG tablet Take 25 mg by mouth 2 (two) times daily.    Historical Provider, MD  Polyethyl Glycol-Propyl Glycol (SYSTANE PRESERVATIVE FREE) 0.4-0.3 % SOLN Place 1 drop into both eyes daily as needed. For dry eyes    Historical Provider, MD  SIMVASTATIN PO Take by mouth.    Historical Provider, MD  traMADol (ULTRAM) 50 MG tablet Take 1 tablet (50 mg total) by mouth every 6 (six) hours as needed. For pain 12/24/12   Samantha J Rhyne, PA-C  travoprost,  benzalkonium, (TRAVATAN) 0.004 % ophthalmic solution Place 1 drop into both eyes at bedtime.     Historical Provider, MD   BP 138/100 mmHg  Pulse 113  Temp(Src) 101.6 F (38.7 C) (Rectal)  Resp 30  Ht 5\' 4"  (1.626 m)  Wt 180 lb (81.647 kg)  BMI 30.88 kg/m2  SpO2 94% Physical Exam  Constitutional: She is oriented to person, place, and time.  Ill-appearing but nontoxic, elderly, no acute distress  HENT:  Head: Normocephalic and atraumatic.  Eyes: Pupils are equal, round, and reactive to light.  Neck: Neck supple.  Cardiovascular: Regular rhythm and normal heart sounds.   Tachycardia  Pulmonary/Chest: Effort normal. No respiratory distress. She has wheezes.  Faint expiratory wheeze right middle and lower lobe  Abdominal: Soft. Bowel sounds are normal. There is no tenderness. There is no rebound.  Musculoskeletal:  1+ lower extremity swelling, symmetric, venous stasis noted  Neurological: She is alert and oriented to person, place, and time.  Skin: Skin is warm and dry.  Psychiatric: She has a normal mood and affect.  Nursing note and vitals reviewed.   ED Course  Procedures (including critical care time) Labs Review Labs Reviewed  CBC WITH DIFFERENTIAL/PLATELET - Abnormal; Notable for the following:    RBC 5.28 (*)    Hemoglobin 15.1 (*)    All other components within normal limits  BASIC METABOLIC PANEL - Abnormal; Notable for the following:    Potassium 3.2 (*)    Glucose, Bld 135 (*)    BUN 28 (*)    Creatinine, Ser 1.08 (*)    GFR calc non Af Amer 44 (*)    GFR calc Af Amer 52 (*)    All other components within normal limits  URINALYSIS, ROUTINE W REFLEX MICROSCOPIC (NOT AT [facility]) - Abnormal; Notable for the following:    APPearance CLOUDY (*)    Hgb urine dipstick TRACE (*)    Nitrite POSITIVE (*)    Leukocytes, UA MODERATE (*)    All other components within normal limits  URINE MICROSCOPIC-ADD ON - Abnormal; Notable for the following:    Squamous Epithelial /  LPF TOO NUMEROUS TO COUNT (*)    Bacteria, UA MANY (*)    All other components within normal limits  I-STAT CG4 LACTIC ACID, ED - Abnormal; Notable for the following:    Lactic Acid, Venous 2.16 (*)    All other components within normal limits  CULTURE, BLOOD (ROUTINE X 2)  CULTURE, BLOOD (ROUTINE X 2)  URINE CULTURE  TROPONIN I  BRAIN NATRIURETIC PEPTIDE  INFLUENZA PANEL BY PCR (TYPE A & B, H1N1)    Imaging Review Dg Chest 2 View  03/23/2016  CLINICAL DATA:  Dyspnea and tremors. EXAM: CHEST  2 VIEW COMPARISON:  03/09/2016 FINDINGS: There is  right middle lobe consolidation, likely pneumonia. No effusions. Normal pulmonary vasculature. The left lung is clear. Heart size is normal. IMPRESSION: Right middle lobe consolidation, likely pneumonia. Followup PA and lateral chest X-ray is recommended in 3-4 weeks following trial of antibiotic therapy to ensure resolution and exclude underlying malignancy. Electronically Signed   By: Andreas Newport M.D.   On: 03/23/2016 05:47   I have personally reviewed and evaluated these images and lab results as part of my medical decision-making.   EKG Interpretation   Date/Time:  Monday March 23 2016 05:25:57 EDT Ventricular Rate:  115 PR Interval:    QRS Duration: 107 QT Interval:  326 QTC Calculation: 451 R Axis:   -74 Text Interpretation:  Sinus tachycardia Ventricular premature complex  Incomplete left bundle branch block Probable left ventricular hypertrophy  Anterior Q waves, possibly due to LVH Confirmed by HORTON  MD, [place]  [id number]) on 03/23/2016 5:30:16 AM      MDM   Final diagnoses:  Community acquired pneumonia  UTI (lower urinary tract infection)    Patient presents with shortness of breath and cough. Denies fevers at home but was noted to have a temperature of 100.1 here. 101.6 rectally.  She is ill-appearing but nontoxic. Mildly tachycardic. O2 sats in the low 90s. Sepsis alert initiated. She was given 30 mL/kg of fluid.   Chest x-ray is concerning for pneumonia. Patient given Rocephin and azithromycin. No risk factors for hospital-acquired pneumonia. Patient also has evidence of a urinary tract infection. Rocephin are dependent given. Lactate mildly elevated at 2.16.  Otherwise lab work is notable for mild hypokalemia. No evidence of end organ damage at this time. Given patient's age and evidence of both pneumonia and UTI, will admit for further management.      Merryl Hacker, MD 03/23/16 [phone]

## 2016-03-23 NOTE — H&P (Addendum)
[facility] History and Physical  Michaela Flynn [id number] DOB: Nov 28, 1925 DOA: 03/23/2016  Referring physician:   PCP: Jani Gravel, MD   Chief Complaint: Cough   HPI:   82 year old lady with past medical history of hypertension, hyperlipidemia, diverticulitis, GERD restenosis, who presents with cough, fever and shortness of breath. According to the daughter the patient was well up until yesterday and not  noticed   difficulty breathing, wheezing, fever until last night . Patient is very hard of hearing. Daughter is by the bedside and gives most of the history. Patient did not have any sick contacts recently. Symptoms were very sudden in onset and associated with tremors and shaking .Patient was brought into the ER, found to have a temperature 101.6, patient also found to be wheezing profoundly. No diarrhea . Found to have right middle lobe infiltration by chest x-ray. Sepsis alert initiated. She was given 30 mL/kg of fluid.Urinalysis positive, but is dirty catch. WBC 7.0, lactate of 2.16, temperature well 1.6, tachycardia, tachypnea, potassium 3.2, creatinine 1.08. Pt is accepted to tele bed as inpt status for community-acquired pneumonia.      Review of Systems: negative for the following  Constitutional: Positive for fever. , chills, diaphoresis, appetite change and fatigue.  HEENT: Denies photophobia, eye pain, redness, hearing loss, ear pain, congestion, sore throat, rhinorrhea, sneezing, mouth sores, trouble swallowing, neck pain, neck stiffness and tinnitus.  RespiratoryPositive for cough and shortness of breath,   chest tightness, and positive for wheezing.  Cardiovascular: Denies chest pain, palpitations and leg swelling.  Gastrointestinal: Denies nausea, vomiting, abdominal pain, diarrhea, constipation, blood in stool and abdominal distention.  Genitourinary: Denies dysuria, urgency, frequency, hematuria, flank pain and difficulty urinating.  Musculoskeletal: Denies  myalgias, back pain, joint swelling, arthralgias and gait problem.  Skin: Denies pallor, rash and wound.  Neurological: Denies dizziness, seizures, syncope, weakness, light-headedness, numbness and headaches. Positive for tremors Hematological: Denies adenopathy. Easy bruising, personal or family bleeding history  Psychiatric/Behavioral: Denies suicidal ideation, mood changes, confusion, nervousness, sleep disturbance and agitation       Past Medical History  Diagnosis Date  . Hyperlipidemia   . Hypertension   . Anxiety and depression   . Glaucoma   . Abnormal LFTs     secondary to cholesterol  medication  . Fibrocystic breast disease   . Diverticulitis   . Shingles 2010  . S/P tonsillectomy and adenoidectomy 1975  . Cancer [facility]) 1972    Nonmelanoma resection  . Arthritis     Lower back," Osteo"  . Carotid artery occlusion     Bruit  . PONV (postoperative nausea and vomiting)      Past Surgical History  Procedure Laterality Date  . Cholecystectomy  1956  . Eye surgery      Cataract  . Appendectomy  1956  . Colonoscopy  2008    [facility] in [place]  . Esophagogastroduodenoscopy      ?  Zenker's per the patient  . Toe surgery      Onychomycosis  . Tonsillectomy and adenoidectomy    . Abdominal hysterectomy  1980's  . Endarterectomy  12/23/2012    Procedure: ENDARTERECTOMY CAROTID;  Surgeon: Serafina Mitchell, MD;  Location: [facility];  Service: Vascular;  Laterality: Right;  . Patch angioplasty  12/23/2012    Procedure: PATCH ANGIOPLASTY;  Surgeon: Serafina Mitchell, MD;  Location: [facility] OR;  Service: Vascular;  Laterality: Right;  Using 1 cm x 6 cm vascu guard patch  . Carotid endarterectomy  Social History:  reports that she has never smoked. She has never used smokeless tobacco. She reports that she does not drink alcohol or use illicit drugs.    No Known Allergies  Family History  Problem Relation Age of Onset  . Stroke Father   . Heart attack Father           Prior to Admission medications   Medication Sig Start Date End Date Taking? Authorizing Provider  aspirin 325 MG tablet Take 325 mg by mouth daily.   Yes Historical Provider, MD  benazepril-hydrochlorthiazide (LOTENSIN HCT) 20-12.5 MG per tablet Take 1 tablet by mouth daily.   Yes Historical Provider, MD  clonazePAM (KLONOPIN) 0.5 MG tablet Take 0.25 mg by mouth at bedtime. For anxiety   Yes Historical Provider, MD  diphenhydrAMINE (BENADRYL) 25 MG tablet Take 25 mg by mouth daily as needed for allergies.   Yes Historical Provider, MD  escitalopram (LEXAPRO) 10 MG tablet Take 5 mg by mouth daily.   Yes Historical Provider, MD  gabapentin (NEURONTIN) 100 MG capsule Take 100 mg by mouth 3 (three) times daily.  02/03/16  Yes Historical Provider, MD  metoprolol tartrate (LOPRESSOR) 25 MG tablet Take 25 mg by mouth 2 (two) times daily.   Yes Historical Provider, MD  Polyethyl Glycol-Propyl Glycol (SYSTANE PRESERVATIVE FREE) 0.4-0.3 % SOLN Place 1 drop into both eyes 2 (two) times daily. For dry eyes   Yes Historical Provider, MD  simvastatin (ZOCOR) 10 MG tablet Take 10 mg by mouth 3 (three) times a week.   Yes Historical Provider, MD  travoprost, benzalkonium, (TRAVATAN) 0.004 % ophthalmic solution Place 1 drop into both eyes at bedtime.    Yes Historical Provider, MD  traMADol (ULTRAM) 50 MG tablet Take 1 tablet (50 mg total) by mouth every 6 (six) hours as needed. For pain 12/24/12   Gabriel Earing, PA-C     Physical Exam: Filed Vitals:   03/23/16 0549 03/23/16 0613 03/23/16 0700 03/23/16 0803  BP:  138/100 164/83 173/85  Pulse:  113 111 112  Temp:  101.6 F (38.7 C) 99.9 F (37.7 C)   TempSrc:  Rectal Oral   Resp:  30 26 31   Height: 5\' 4"  (1.626 m)     Weight: 81.647 kg (180 lb)     SpO2:  94% 96% 97%     Constitutional: Vital signs reviewed. Ill-appearing but nontoxic, elderly, no acute distress  and cooperative with exam. Alert and oriented x3.  Head: Normocephalic and  atraumatic  Ear: TM normal bilaterally  Mouth: no erythema or exudates, MMM  Eyes: PERRL, EOMI, conjunctivae normal, No scleral icterus.  Neck: Supple, Trachea midline normal ROM, No JVD, mass, thyromegaly, or carotid bruit present.  Cardiovascular: RRR, S1 normal, S2 normal, no MRG, pulses symmetric and intact bilaterally  Pulmonary/Chest: Faint expiratory wheeze right middle and lower lobe  Abdominal: Soft. Non-tender, non-distended, bowel sounds are normal, no masses, organomegaly, or guarding present.  GU: no CVA tenderness Musculoskeletal: No joint deformities, erythema, or stiffness,1+ lower extremity swelling, symmetric, venous stasis noted  ROM full and no nontender Ext: no edema and no cyanosis, pulses palpable bilaterally (DP and PT)  Hematology: no cervical, inginal, or axillary adenopathy.  Neurological: A&O x3, Strenght is normal and symmetric bilaterally, cranial nerve II-XII are grossly intact, no focal motor deficit, sensory intact to light touch bilaterally.  Skin: Warm, dry and intact. No rash, cyanosis, or clubbing.  Psychiatric: Normal mood and affect. speech and behavior is normal. Judgment and  thought content normal. Cognition and memory are normal.      Data Review   Micro Results No results found for this or any previous visit (from the past 240 hour(s)).  Radiology Reports Dg Chest 2 View  03/23/2016  CLINICAL DATA:  Dyspnea and tremors. EXAM: CHEST  2 VIEW COMPARISON:  03/09/2016 FINDINGS: There is right middle lobe consolidation, likely pneumonia. No effusions. Normal pulmonary vasculature. The left lung is clear. Heart size is normal. IMPRESSION: Right middle lobe consolidation, likely pneumonia. Followup PA and lateral chest X-ray is recommended in 3-4 weeks following trial of antibiotic therapy to ensure resolution and exclude underlying malignancy. Electronically Signed   By: Andreas Newport M.D.   On: 03/23/2016 05:47     CBC  Recent Labs Lab  03/23/16 0537  WBC 7.0  HGB 15.1*  HCT 46.0  PLT 202  MCV 87.1  MCH 28.6  MCHC 32.8  RDW 13.0  LYMPHSABS 0.7  MONOABS 0.2  EOSABS 0.1  BASOSABS 0.0    Chemistries   Recent Labs Lab 03/23/16 0537  NA 142  K 3.2*  CL 104  CO2 27  GLUCOSE 135*  BUN 28*  CREATININE 1.08*  CALCIUM 9.1   ------------------------------------------------------------------------------------------------------------------ estimated creatinine clearance is 37.2 mL/min (by C-G formula based on Cr of 1.08). ------------------------------------------------------------------------------------------------------------------ No results for input(s): HGBA1C in the last 72 hours. ------------------------------------------------------------------------------------------------------------------ No results for input(s): CHOL, HDL, LDLCALC, TRIG, CHOLHDL, LDLDIRECT in the last 72 hours. ------------------------------------------------------------------------------------------------------------------ No results for input(s): TSH, T4TOTAL, T3FREE, THYROIDAB in the last 72 hours.  Invalid input(s): FREET3 ------------------------------------------------------------------------------------------------------------------ No results for input(s): VITAMINB12, FOLATE, FERRITIN, TIBC, IRON, RETICCTPCT in the last 72 hours.  Coagulation profile No results for input(s): INR, PROTIME in the last 168 hours.  No results for input(s): DDIMER in the last 72 hours.  Cardiac Enzymes  Recent Labs Lab 03/23/16 0537  TROPONINI <0.03   ------------------------------------------------------------------------------------------------------------------ Invalid input(s): POCBNP   CBG: No results for input(s): GLUCAP in the last 168 hours.     EKG: Independently reviewed.  Date/Time: Monday March 23 2016 05:25:57 EDT Ventricular Rate: 115 PR Interval:  QRS Duration: 107 QT Interval: 326 QTC Calculation: 451 R  Axis: -74 Text Interpretation: Sinus tachycardia Ventricular premature complex  Incomplete left bundle branch block Probable left ventricular hypertrophy  Anterior Q waves, possibly due to LVH    Assessment/Plan Principal Problem:   CAP (community acquired pneumonia) Pneumonia protocol initiated Patient started on Rocephin and azithromycin Urine Legionella antigen, strep pneumo antigen, HIV antibody, respiratory virus panel, influenza panel, blood culture 2 No prior history of COPD/asthma but patient is wheezing therefore started on prednisone Elevated lactate likely consistent with dehydration  Mild urinary tract infection-follow urine culture, continue Rocephin  Hypokalemia replete-check magnesium levels  History of gastroesophageal reflux, started PPI  Abnormal troponin Like demand ischemia , continue aspirin 325 mg  Echo to r/o wall motion abnormalities     Code Status Orders        Start     Ordered   03/23/16 0731  Full code   Continuous     03/23/16 0732   Family Communication: bedside Disposition Plan: admit   Total time spent 55 minutes.Greater than 50% of this time was spent in counseling, explanation of diagnosis, planning of further management, and coordination of care  [facility] Pager [phone]  If 7PM-7AM, please contact night-coverage [email or web address] Password [facility] 03/23/2016, 8:57 AM

## 2016-03-24 DIAGNOSIS — E876 Hypokalemia: Secondary | ICD-10-CM

## 2016-03-24 LAB — RESPIRATORY VIRUS PANEL
Adenovirus: NEGATIVE
INFLUENZA B 1: NEGATIVE
Influenza A: NEGATIVE
METAPNEUMOVIRUS: NEGATIVE
PARAINFLUENZA 1 A: NEGATIVE
PARAINFLUENZA 2 A: NEGATIVE
PARAINFLUENZA 3 A: NEGATIVE
RESPIRATORY SYNCYTIAL VIRUS A: NEGATIVE
Respiratory Syncytial Virus B: NEGATIVE
Rhinovirus: NEGATIVE

## 2016-03-24 LAB — CBC
HCT: 35.9 % — ABNORMAL LOW (ref 36.0–46.0)
HEMOGLOBIN: 12.1 g/dL (ref 12.0–15.0)
MCH: 29.4 pg (ref 26.0–34.0)
MCHC: 33.7 g/dL (ref 30.0–36.0)
MCV: 87.1 fL (ref 78.0–100.0)
PLATELETS: 188 10*3/uL (ref 150–400)
RBC: 4.12 MIL/uL (ref 3.87–5.11)
RDW: 13.2 % (ref 11.5–15.5)
WBC: 17.2 10*3/uL — ABNORMAL HIGH (ref 4.0–10.5)

## 2016-03-24 LAB — COMPREHENSIVE METABOLIC PANEL
ALBUMIN: 2.7 g/dL — AB (ref 3.5–5.0)
ALT: 24 U/L (ref 14–54)
AST: 22 U/L (ref 15–41)
Alkaline Phosphatase: 55 U/L (ref 38–126)
Anion gap: 6 (ref 5–15)
BUN: 19 mg/dL (ref 6–20)
CHLORIDE: 112 mmol/L — AB (ref 101–111)
CO2: 25 mmol/L (ref 22–32)
CREATININE: 0.75 mg/dL (ref 0.44–1.00)
Calcium: 8.1 mg/dL — ABNORMAL LOW (ref 8.9–10.3)
GFR calc non Af Amer: >60 mL/min (ref >60)
GLUCOSE: 94 mg/dL (ref 65–99)
Potassium: 4 mmol/L (ref 3.5–5.1)
SODIUM: 143 mmol/L (ref 135–145)
Total Bilirubin: 0.5 mg/dL (ref 0.3–1.2)
Total Protein: 5.8 g/dL — ABNORMAL LOW (ref 6.5–8.1)

## 2016-03-24 LAB — MAGNESIUM: Magnesium: 2.3 mg/dL (ref 1.7–2.4)

## 2016-03-24 LAB — LEGIONELLA PNEUMOPHILA SEROGP 1 UR AG: L. pneumophila Serogp 1 Ur Ag: NEGATIVE

## 2016-03-24 LAB — HIV ANTIBODY (ROUTINE TESTING W REFLEX): HIV SCREEN 4TH GENERATION: NONREACTIVE

## 2016-03-24 MED ORDER — ALBUTEROL SULFATE (2.5 MG/3ML) 0.083% IN NEBU
2.5000 mg | INHALATION_SOLUTION | RESPIRATORY_TRACT | Status: DC | PRN
  Administered 2016-03-25 – 2016-03-26 (×2): 2.5 mg via RESPIRATORY_TRACT
  Filled 2016-03-24: qty 3

## 2016-03-24 MED ORDER — IPRATROPIUM-ALBUTEROL 0.5-2.5 (3) MG/3ML IN SOLN
3.0000 mL | Freq: Three times a day (TID) | RESPIRATORY_TRACT | Status: DC
  Administered 2016-03-25 – 2016-03-26 (×6): 3 mL via RESPIRATORY_TRACT
  Filled 2016-03-24 (×8): qty 3

## 2016-03-24 MED ORDER — ALBUTEROL SULFATE (2.5 MG/3ML) 0.083% IN NEBU: 2.5000 mg | INHALATION_SOLUTION | RESPIRATORY_TRACT | Status: DC | PRN

## 2016-03-24 MED ORDER — IPRATROPIUM-ALBUTEROL 0.5-2.5 (3) MG/3ML IN SOLN
3.0000 mL | Freq: Four times a day (QID) | RESPIRATORY_TRACT | Status: DC
  Administered 2016-03-24: 3 mL via RESPIRATORY_TRACT
  Filled 2016-03-24: qty 3

## 2016-03-24 NOTE — Progress Notes (Addendum)
PROGRESS NOTE    Michaela Flynn  BJY:[id number]  DOB: [birth year]  DOA: 03/23/2016 PCP: Jani Gravel, MD Outpatient Specialists:   Hospital course: 82 year old female with PMH of extreme hard of hearing, HLD, HTN, anxiety, depression, GERD presented to ED with complaints of fever, cough, wheezing, dyspnea. In the ED, temperature of 101.6, wheezing and chest x-ray showed RML pneumonia. Treated per sepsis protocol and admitted for community-acquired pneumonia. Improving.   Assessment & Plan:   Community-acquired pneumonia, RML - Initiated on IV azithromycin and ceftriaxone, continue same. - Urine streptococcal antigen: Negative. Urine Legionella antigen: Negative. Lactate has normalized. RSV panel: Pending. Influenza panel PCR: Negative. HIV antibodies: Nonreactive. Blood cultures 2: Negative to date.  Gram-negative rod UTI - Continue IV Rocephin pending final culture results.  Sepsis - Met sepsis criteria on admission. Secondary to pneumonia and UTI. Sepsis physiology resolved.  Asthma/COPD exacerbation. - Improving. Continue prednisone. Add bronchodilator nebulizations.  Hypokalemia/hypomagnesemia - Replaced. Check and replace magnesium.  GERD - PPI.  Elevated troponin - No chest pain reported. Flat trend. - Continue aspirin. Follow 2-D echo for LV function and wall motion abnormalities.  Essential hypertension - Reasonable inpatient control  Hyperlipidemia - Statins  Anxiety and depression - Continue Lexapro and Klonopin.  DVT prophylaxis: Lovenox Code Status: Full Family Communication: None at bedside Disposition Plan: DC home when medically stable   Consultants:  None  Procedures:  None  Antimicrobials:  IV Rocephin 4/9 >  IV azithromycin 4/9 >  Subjective: Feels better. Mild dry cough. Denies dyspnea. As per nursing, no acute issues.  Objective: Filed Vitals:   03/23/16 0904 03/23/16 1815 03/23/16 2244 03/24/16 0522  BP: 128/68 128/72 159/66  140/60  Pulse: 101 102 87 68  Temp: 98.9 F (37.2 C) 98.9 F (37.2 C) 98.5 F (36.9 C) 98.4 F (36.9 C)  TempSrc: Oral Oral Oral Oral  Resp: '20 20  18  '$ Height:      Weight:      SpO2: 97% 98% 100% 100%    Intake/Output Summary (Last 24 hours) at 03/24/16 0721 Last data filed at 03/24/16 0865  Gross per 24 hour  Intake 4701.66 ml  Output      0 ml  Net 4701.66 ml   Filed Weights   03/23/16 0549  Weight: 81.647 kg (180 lb)    Exam:  General exam: Pleasant elderly female lying comfortably propped up in bed. Extremely hard of hearing. Respiratory system: Diminished breath sounds in the bases. Occasional rhonchi posteriorly. No increased work of breathing. Cardiovascular system: S1 & S2 heard, RRR. No JVD, murmurs, gallops, clicks or pedal edema.  Gastrointestinal system: Abdomen is nondistended, soft and nontender. Normal bowel sounds heard. Central nervous system: Alert and oriented. No focal neurological deficits. Extremities: Symmetric 5 x 5 power.   Data Reviewed: Basic Metabolic Panel:  Recent Labs Lab 03/23/16 0537 03/23/16 0852 03/24/16 0433  NA 142  --  143  K 3.2*  --  4.0  CL 104  --  112*  CO2 27  --  25  GLUCOSE 135*  --  94  BUN 28*  --  19  CREATININE 1.08*  --  0.75  CALCIUM 9.1  --  8.1*  MG  --  1.5*  --    Liver Function Tests:  Recent Labs Lab 03/24/16 0433  AST 22  ALT 24  ALKPHOS 55  BILITOT 0.5  PROT 5.8*  ALBUMIN 2.7*   No results for input(s): LIPASE, AMYLASE in the last 168 hours.  No results for input(s): AMMONIA in the last 168 hours. CBC:  Recent Labs Lab 03/23/16 0537 03/24/16 0433  WBC 7.0 17.2*  NEUTROABS 6.1  --   HGB 15.1* 12.1  HCT 46.0 35.9*  MCV 87.1 87.1  PLT 202 188   Cardiac Enzymes:  Recent Labs Lab 03/23/16 0537 03/23/16 0852 03/23/16 1354  TROPONINI <0.03 0.14* 0.12*   BNP (last 3 results) No results for input(s): PROBNP in the last 8760 hours. CBG: No results for input(s): GLUCAP in the  last 168 hours.  No results found for this or any previous visit (from the past 240 hour(s)).       Studies: Dg Chest 2 View  03/23/2016  CLINICAL DATA:  Dyspnea and tremors. EXAM: CHEST  2 VIEW COMPARISON:  03/09/2016 FINDINGS: There is right middle lobe consolidation, likely pneumonia. No effusions. Normal pulmonary vasculature. The left lung is clear. Heart size is normal. IMPRESSION: Right middle lobe consolidation, likely pneumonia. Followup PA and lateral chest X-ray is recommended in 3-4 weeks following trial of antibiotic therapy to ensure resolution and exclude underlying malignancy. Electronically Signed   By: Andreas Newport M.D.   On: 03/23/2016 05:47        Scheduled Meds: . antiseptic oral rinse  7 mL Mouth Rinse q12n4p  . aspirin  325 mg Oral Daily  . azithromycin  500 mg Intravenous Q24H  . cefTRIAXone (ROCEPHIN)  IV  1 g Intravenous Q24H  . chlorhexidine  15 mL Mouth Rinse BID  . clonazePAM  0.25 mg Oral QHS  . enoxaparin (LOVENOX) injection  40 mg Subcutaneous Q24H  . escitalopram  5 mg Oral Daily  . gabapentin  100 mg Oral TID  . latanoprost  1 drop Both Eyes QHS  . metoprolol tartrate  25 mg Oral BID  . predniSONE  50 mg Oral Q breakfast  . simvastatin  10 mg Oral Once per day on Mon Wed Fri   Continuous Infusions: . 0.9 % NaCl with KCl 40 mEq / L 100 mL/hr (03/24/16 0514)    Principal Problem:   CAP (community acquired pneumonia) Active Problems:   Community acquired pneumonia   UTI (lower urinary tract infection)    Time spent: 30 minutes.     Vernell Leep, MD, FACP, FHM. [facility] Pager [phone]  If 7PM-7AM, please contact night-coverage [email or web address] Password [email or web address] 03/24/2016, 7:21 AM    LOS: 1 day

## 2016-03-25 ENCOUNTER — Inpatient Hospital Stay (HOSPITAL_COMMUNITY): Payer: Medicare Other

## 2016-03-25 DIAGNOSIS — N390 Urinary tract infection, site not specified: Secondary | ICD-10-CM

## 2016-03-25 DIAGNOSIS — J189 Pneumonia, unspecified organism: Secondary | ICD-10-CM

## 2016-03-25 DIAGNOSIS — A419 Sepsis, unspecified organism: Principal | ICD-10-CM

## 2016-03-25 DIAGNOSIS — M79606 Pain in leg, unspecified: Secondary | ICD-10-CM

## 2016-03-25 DIAGNOSIS — R079 Chest pain, unspecified: Secondary | ICD-10-CM

## 2016-03-25 LAB — URINE CULTURE: Culture: >=100000 — AB

## 2016-03-25 LAB — ECHOCARDIOGRAM COMPLETE
HEIGHTINCHES: 64 in
WEIGHTICAEL: 2880 [oz_av]

## 2016-03-25 MED ORDER — HYDRALAZINE HCL 20 MG/ML IJ SOLN
10.0000 mg | Freq: Once | INTRAMUSCULAR | Status: AC
  Administered 2016-03-25: 10 mg via INTRAVENOUS
  Filled 2016-03-25: qty 1

## 2016-03-25 NOTE — Progress Notes (Addendum)
PROGRESS NOTE  Shyanne Mcclary SWF:[id number] DOB: December 07, 1925 DOA: 03/23/2016 PCP: Jani Gravel, MD  Hospital course: 82 year old female with PMH of extreme hard of hearing, HLD, HTN, anxiety, depression, GERD presented to ED with complaints of fever, cough, wheezing, dyspnea. In the ED, temperature of 101.6, wheezing and chest x-ray showed RML pneumonia. Treated per sepsis protocol and admitted for community-acquired pneumonia.   Assessment & Plan:  Community-acquired pneumonia, RML - Initiated on IV azithromycin and ceftriaxone, continue same. - Urine streptococcal antigen: Negative. Urine Legionella antigen: Negative. Lactate has normalized.  -RSV panel: Pending. Influenza panel PCR: Negative.  -HIV antibodies: Nonreactive.  -Blood cultures 2: Negative to date. -03/25/2016--chest x-ray bilateral lower lobe opacities, worsening on the left -Repeat CBC with differential in the morning  E Coli UTI - Continue IV Rocephin pending final culture results.  Sepsis - Met sepsis criteria on admission. Secondary to pneumonia and UTI. Sepsis physiology resolved.  Leg pain and edema -duplex r/o DVT  Asthma/COPD exacerbation. - Improving. Continue prednisone-->wean -Continue bronchodilators  Hypokalemia/hypomagnesemia - Replaced. Check and replace magnesium.  GERD - PPI.  Elevated troponin - No chest pain reported. Flat trend. - Continue aspirin.  -Follow 2-D echo for LV function and wall motion abnormalities.  Essential hypertension - Reasonable inpatient control  Hyperlipidemia - Statins  Anxiety and depression - Continue Lexapro and Klonopin.  DVT prophylaxis: Lovenox Code Status: DNR Family Communication: Daughter updated at bedside 4/12 Disposition Plan:  SNF likely 4/13 or 4/14 if improves   Consultants:  None  Procedures:  None  Antimicrobials:  IV Rocephin 4/9 >  IV azithromycin 4/9 >   Procedures/Studies: Dg Chest 2 View  03/23/2016   CLINICAL DATA:  Dyspnea and tremors. EXAM: CHEST  2 VIEW COMPARISON:  03/09/2016 FINDINGS: There is right middle lobe consolidation, likely pneumonia. No effusions. Normal pulmonary vasculature. The left lung is clear. Heart size is normal. IMPRESSION: Right middle lobe consolidation, likely pneumonia. Followup PA and lateral chest X-ray is recommended in 3-4 weeks following trial of antibiotic therapy to ensure resolution and exclude underlying malignancy. Electronically Signed   By: Andreas Newport M.D.   On: 03/23/2016 05:47   Dg Chest Port 1 View  03/25/2016  CLINICAL DATA:  Recent pneumonia.  Shortness of breath, weakness EXAM: PORTABLE CHEST 1 VIEW COMPARISON:  03/23/2016 FINDINGS: Bilateral lower lobe airspace opacities are noted, similar on the right prior study, increasing on the left since prior study. Heart is normal size. No visible effusions. No acute bony abnormality. IMPRESSION: Bilateral lower lobe pneumonia, worsening on the left since prior study. Electronically Signed   By: Rolm Baptise M.D.   On: 03/25/2016 16:41         Subjective: Patient denies fevers, chills, headache, chest pain,  nausea, vomiting, diarrhea, abdominal pain, dysuria, hematuria. C/o dyspnea although a little better when compared to admission   Objective: Filed Vitals:   03/25/16 1500 03/25/16 1515 03/25/16 1527 03/25/16 1553  BP:  152/53    Pulse: 87 89    Temp:  97.8 F (36.6 C)    TempSrc:  Oral    Resp: 18 18    Height:      Weight:      SpO2:  97% 90% 92%    Intake/Output Summary (Last 24 hours) at 03/25/16 1746 Last data filed at 03/25/16 1733  Gross per 24 hour  Intake 2206.67 ml  Output      0 ml  Net 2206.67  ml   Weight change:  Exam:   General:  Pt is alert, follows commands appropriately, not in acute distress  HEENT: No icterus, No thrush, No neck mass, [place]/AT  Cardiovascular: RRR, S1/S2, no rubs, no gallops  Respiratory: Diminished breath sounds on the right base.  Bibasilar rales, right greater than left. No wheezing.  Abdomen: Soft/+BS, non tender, non distended, no guarding  Extremities: 1+LE edema, No lymphangitis, No petechiae, No rashes, no synovitis  Data Reviewed: Basic Metabolic Panel:  Recent Labs Lab 03/23/16 0537 03/23/16 0852 03/24/16 0433 03/24/16 1908  NA 142  --  143  --   K 3.2*  --  4.0  --   CL 104  --  112*  --   CO2 27  --  25  --   GLUCOSE 135*  --  94  --   BUN 28*  --  19  --   CREATININE 1.08*  --  0.75  --   CALCIUM 9.1  --  8.1*  --   MG  --  1.5*  --  2.3   Liver Function Tests:  Recent Labs Lab 03/24/16 0433  AST 22  ALT 24  ALKPHOS 55  BILITOT 0.5  PROT 5.8*  ALBUMIN 2.7*   No results for input(s): LIPASE, AMYLASE in the last 168 hours. No results for input(s): AMMONIA in the last 168 hours. CBC:  Recent Labs Lab 03/23/16 0537 03/24/16 0433  WBC 7.0 17.2*  NEUTROABS 6.1  --   HGB 15.1* 12.1  HCT 46.0 35.9*  MCV 87.1 87.1  PLT 202 188   Cardiac Enzymes:  Recent Labs Lab 03/23/16 0537 03/23/16 0852 03/23/16 1354  TROPONINI <0.03 0.14* 0.12*   BNP: Invalid input(s): POCBNP CBG: No results for input(s): GLUCAP in the last 168 hours.  Recent Results (from the past 240 hour(s))  Blood Culture (routine x 2)     Status: None (Preliminary result)   Collection Time: 03/23/16  5:30 AM  Result Value Ref Range Status   Specimen Description BLOOD LEFT FOREARM  Final   Special Requests BOTTLES DRAWN AEROBIC AND ANAEROBIC 5ML  Final   Culture   Final    NO GROWTH 2 DAYS Performed at [facility]    Report Status PENDING  Incomplete  Blood Culture (routine x 2)     Status: None (Preliminary result)   Collection Time: 03/23/16  5:34 AM  Result Value Ref Range Status   Specimen Description BLOOD RIGHT ANTECUBITAL  Final   Special Requests BOTTLES DRAWN AEROBIC AND ANAEROBIC 5ML  Final   Culture   Final    NO GROWTH 2 DAYS Performed at [facility]    Report Status  PENDING  Incomplete  Urine culture     Status: Abnormal   Collection Time: 03/23/16  5:44 AM  Result Value Ref Range Status   Specimen Description URINE, CLEAN CATCH  Final   Special Requests NONE  Final   Culture >=100,000 COLONIES/mL ESCHERICHIA COLI (A)  Final   Report Status 03/25/2016 FINAL  Final   Organism ID, Bacteria ESCHERICHIA COLI (A)  Final      Susceptibility   Escherichia coli - MIC*    AMPICILLIN <=2 SENSITIVE Sensitive     CEFAZOLIN <=4 SENSITIVE Sensitive     CEFTRIAXONE <=1 SENSITIVE Sensitive     CIPROFLOXACIN <=0.25 SENSITIVE Sensitive     GENTAMICIN <=1 SENSITIVE Sensitive     IMIPENEM <=0.25 SENSITIVE Sensitive     NITROFURANTOIN <=16  SENSITIVE Sensitive     TRIMETH/SULFA <=20 SENSITIVE Sensitive     AMPICILLIN/SULBACTAM <=2 SENSITIVE Sensitive     PIP/TAZO <=4 SENSITIVE Sensitive     * >=100,000 COLONIES/mL ESCHERICHIA COLI  Respiratory virus panel     Status: None   Collection Time: 03/23/16  9:15 AM  Result Value Ref Range Status   Respiratory Syncytial Virus A Negative Negative Final   Respiratory Syncytial Virus B Negative Negative Final   Influenza A Negative Negative Final   Influenza B Negative Negative Final   Parainfluenza 1 Negative Negative Final   Parainfluenza 2 Negative Negative Final   Parainfluenza 3 Negative Negative Final   Metapneumovirus Negative Negative Final   Rhinovirus Negative Negative Final   Adenovirus Negative Negative Final    Comment: (NOTE) Performed At: [facility] [address] [place], [place] [id number] Lindon Romp MD OT:[id number]      Scheduled Meds: . antiseptic oral rinse  7 mL Mouth Rinse q12n4p  . aspirin  325 mg Oral Daily  . azithromycin  500 mg Intravenous Q24H  . cefTRIAXone (ROCEPHIN)  IV  1 g Intravenous Q24H  . chlorhexidine  15 mL Mouth Rinse BID  . clonazePAM  0.25 mg Oral QHS  . enoxaparin (LOVENOX) injection  40 mg Subcutaneous Q24H  . escitalopram  5 mg Oral Daily  .  gabapentin  100 mg Oral TID  . ipratropium-albuterol  3 mL Nebulization TID  . latanoprost  1 drop Both Eyes QHS  . metoprolol tartrate  25 mg Oral BID  . predniSONE  50 mg Oral Q breakfast  . simvastatin  10 mg Oral Once per day on Mon Wed Fri   Continuous Infusions:    Thandiwe Siragusa, DO  [facility] Pager [phone]  If 7PM-7AM, please contact night-coverage [email or web address] Password [email or web address] 03/25/2016, 5:46 PM   LOS: 2 days

## 2016-03-25 NOTE — Progress Notes (Signed)
BP 193/88. Pt asymptomatic. NP on call notified. New order placed. Will continue to monitor closely.

## 2016-03-25 NOTE — Evaluation (Signed)
Physical Therapy Evaluation Patient Details Name: Michaela Flynn MRN: [id number] DOB: [birth year] Today's Date: 03/25/2016   History of Present Illness  82 year old female with PMH of extreme hard of hearing, HLD, HTN, anxiety, depression, GERD and admitted for CAP  Clinical Impression  Pt admitted with above diagnosis. Pt currently with functional limitations due to the deficits listed below (see PT Problem List).  Pt will benefit from skilled PT to increase their independence and safety with mobility to allow discharge to the venue listed below.   Pt from home alone with some caregiver support and family attempts to rotate.  Daughter present and concerned about pt returning home alone as she will not be available to assist this coming weekend due to working.  Recommend SNF at this time.     Follow Up Recommendations SNF;Supervision/Assistance - 24 hour    Equipment Recommendations  None recommended by PT    Recommendations for Other Services       Precautions / Restrictions Precautions Precautions: Fall Precaution Comments: monitor sats      Mobility  Bed Mobility Overal bed mobility: Needs Assistance Bed Mobility: Supine to Sit;Sit to Supine     Supine to sit: Min assist Sit to supine: Min guard   General bed mobility comments: slight assist for trunk upright, SpO2 93% on room air  Transfers Overall transfer level: Needs assistance Equipment used: Rolling walker (2 wheeled) Transfers: Sit to/from Stand Sit to Stand: Min assist         General transfer comment: multimodal cues (since pt HOH) for technique, assist to rise and steady  Ambulation/Gait Ambulation/Gait assistance: Min guard Ambulation Distance (Feet): 60 Feet Assistive device: Rolling walker (2 wheeled) Gait Pattern/deviations: Step-through pattern;Decreased stride length;Trunk flexed Gait velocity: decreased   General Gait Details: multimodal cues for RW use, pt reports fatigue, weakness and mild SOB,  SpO2 90% on room air upon return to room (RN present and agreeable to leave off O2 [place])  Stairs            Wheelchair Mobility    Modified Rankin (Stroke Patients Only)       Balance Overall balance assessment: History of Falls;Needs assistance (daughter reports most recent fall was a year ago)         Standing balance support: Bilateral upper extremity supported;During functional activity Standing balance-Leahy Scale: Poor Standing balance comment: requires UE support                             Pertinent Vitals/Pain Pain Assessment: No/denies pain    Home Living Family/patient expects to be discharged to:: Private residence Living Arrangements: Alone   Type of Home: House Home Access: Level entry     Home Layout: One level Home Equipment: Environmental consultant - 4 wheels (rollator)      Prior Function Level of Independence: Independent with assistive device(s);Needs assistance   Gait / Transfers Assistance Needed: ambulates with rollator  ADL's / Homemaking Assistance Needed: has aide 5 hrs/day 5 days/week for assist for homemaking and meals  Comments: daughter present and assisted with providing information since pt is very HOH     Hand Dominance        Extremity/Trunk Assessment               Lower Extremity Assessment: Generalized weakness         Communication   Communication: HOH  Cognition Arousal/Alertness: Awake/alert Behavior During Therapy: WFL for tasks assessed/performed Overall Cognitive  Status: Within Functional Limits for tasks assessed                      General Comments      Exercises        Assessment/Plan    PT Assessment Patient needs continued PT services  PT Diagnosis Difficulty walking;Generalized weakness   PT Problem List Decreased strength;Decreased activity tolerance;Decreased mobility;Decreased knowledge of use of DME  PT Treatment Interventions DME instruction;Gait training;Functional  mobility training;Patient/family education;Therapeutic activities;Therapeutic exercise   PT Goals (Current goals can be found in the Care Plan section) Acute Rehab PT Goals PT Goal Formulation: With patient/family Time For Goal Achievement: 04/01/16 Potential to Achieve Goals: Good    Frequency Min 3X/week   Barriers to discharge        Co-evaluation               End of Session Equipment Utilized During Treatment: Gait belt Activity Tolerance: Patient limited by fatigue Patient left: in bed;with call bell/phone within reach;with bed alarm set Nurse Communication: Mobility status         Time: 1520-1540 PT Time Calculation (min) (ACUTE ONLY): 20 min   Charges:   PT Evaluation $PT Eval Low Complexity: 1 Procedure     PT G Codes:        Yanci Bachtell,KATHrine E 03/25/2016, 4:19 PM Carmelia Bake, PT, DPT 03/25/2016 Pager: [phone]

## 2016-03-25 NOTE — Progress Notes (Signed)
*  PRELIMINARY RESULTS* Echocardiogram 2D Echocardiogram has been performed.  Michaela Flynn 03/25/2016, 12:38 PM

## 2016-03-26 ENCOUNTER — Inpatient Hospital Stay (HOSPITAL_COMMUNITY): Payer: Medicare Other

## 2016-03-26 DIAGNOSIS — J441 Chronic obstructive pulmonary disease with (acute) exacerbation: Secondary | ICD-10-CM

## 2016-03-26 DIAGNOSIS — A4151 Sepsis due to Escherichia coli [E. coli]: Secondary | ICD-10-CM

## 2016-03-26 DIAGNOSIS — M79606 Pain in leg, unspecified: Secondary | ICD-10-CM

## 2016-03-26 LAB — BASIC METABOLIC PANEL
Anion gap: 8 (ref 5–15)
BUN: 18 mg/dL (ref 6–20)
CALCIUM: 9.1 mg/dL (ref 8.9–10.3)
CHLORIDE: 110 mmol/L (ref 101–111)
CO2: 26 mmol/L (ref 22–32)
CREATININE: 0.74 mg/dL (ref 0.44–1.00)
Glucose, Bld: 101 mg/dL — ABNORMAL HIGH (ref 65–99)
Potassium: 4.2 mmol/L (ref 3.5–5.1)
SODIUM: 144 mmol/L (ref 135–145)

## 2016-03-26 LAB — CBC WITH DIFFERENTIAL/PLATELET
BASOS ABS: 0 10*3/uL (ref 0.0–0.1)
BASOS PCT: 0 %
EOS ABS: 0 10*3/uL (ref 0.0–0.7)
Eosinophils Relative: 0 %
HCT: 37.2 % (ref 36.0–46.0)
HEMOGLOBIN: 12.3 g/dL (ref 12.0–15.0)
LYMPHS ABS: 1.1 10*3/uL (ref 0.7–4.0)
Lymphocytes Relative: 9 %
MCH: 29.5 pg (ref 26.0–34.0)
MCHC: 33.1 g/dL (ref 30.0–36.0)
MCV: 89.2 fL (ref 78.0–100.0)
Monocytes Absolute: 0.8 10*3/uL (ref 0.1–1.0)
Monocytes Relative: 8 %
NEUTROS PCT: 83 %
Neutro Abs: 9.3 10*3/uL — ABNORMAL HIGH (ref 1.7–7.7)
PLATELETS: 215 10*3/uL (ref 150–400)
RBC: 4.17 MIL/uL (ref 3.87–5.11)
RDW: 13.8 % (ref 11.5–15.5)
WBC: 11.2 10*3/uL — AB (ref 4.0–10.5)

## 2016-03-26 MED ORDER — NYSTATIN 100000 UNIT/ML MT SUSP: 5.0000 mL | Freq: Four times a day (QID) | OROMUCOSAL | Status: DC

## 2016-03-26 MED ORDER — MICONAZOLE NITRATE 2 % EX CREA
TOPICAL_CREAM | Freq: Two times a day (BID) | CUTANEOUS | Status: DC
  Administered 2016-03-26 – 2016-03-27 (×2): via TOPICAL
  Filled 2016-03-26: qty 14

## 2016-03-26 MED ORDER — PHENOL 1.4 % MT LIQD
1.0000 | OROMUCOSAL | Status: DC | PRN
  Filled 2016-03-26: qty 177

## 2016-03-26 NOTE — Clinical Social Work Placement (Signed)
   CLINICAL SOCIAL WORK PLACEMENT  NOTE  Date:  03/26/2016  Patient Details  Name: Michaela Flynn MRN: [id number] Date of Birth: 1925/11/18  Clinical Social Work is seeking post-discharge placement for this patient at the [place] level of care (*CSW will initial, date and re-position this form in  chart as items are completed):  Yes   Patient/family provided with [facility] Work Department's list of facilities offering this level of care within the geographic area requested by the patient (or if unable, by the patient's family).  Yes   Patient/family informed of their freedom to choose among providers that offer the needed level of care, that participate in Medicare, Medicaid or managed care program needed by the patient, have an available bed and are willing to accept the patient.  Yes   Patient/family informed of [facility]'s ownership interest in [facility] and [facility], as well as of the fact that they are under no obligation to receive care at these facilities.  PASRR submitted to EDS on 03/26/16     PASRR number received on 03/26/16     Existing PASRR number confirmed on       FL2 transmitted to all facilities in geographic area requested by pt/family on 03/26/16     FL2 transmitted to all facilities within larger geographic area on       Patient informed that his/her managed care company has contracts with or will negotiate with certain facilities, including the following:        Yes   Patient/family informed of bed offers received.  Patient chooses bed at       Physician recommends and patient chooses bed at      Patient to be transferred to   on  .  Patient to be transferred to facility by       Patient family notified on   of transfer.  Name of family member notified:        PHYSICIAN       Additional Comment:    _______________________________________________ Standley Brooking, LCSW 03/26/2016, 1:07 PM

## 2016-03-26 NOTE — Progress Notes (Signed)
PROGRESS NOTE  Michaela Flynn GYB:[id number] DOB: 06-23-1925 DOA: 03/23/2016 PCP: Jani Gravel, MD  Hospital course: 82 year old female with PMH of extreme hard of hearing, HLD, HTN, anxiety, depression, GERD presented to ED with complaints of fever, cough, wheezing, dyspnea. In the ED, temperature of 101.6, wheezing and chest x-ray showed RML pneumonia. Treated per sepsis protocol and admitted for community-acquired pneumonia.   Assessment & Plan:  Community-acquired pneumonia, RML - Initiated on IV azithromycin and ceftriaxone, continue same. - Urine streptococcal antigen: Negative. Urine Legionella antigen: Negative. Lactate has normalized.  -RSV panel: Pending. Influenza panel PCR: Negative.  -HIV antibodies: Nonreactive.  -Blood cultures 2: Negative to date. -03/25/2016--chest x-ray bilateral lower lobe opacities, worsening on the left -Repeat CBC with differential in the morning  E Coli UTI - Continue IV Rocephin for now in setting of CAP - plan to d/c with cefuroxime  Sepsis - Met sepsis criteria on admission. Secondary to pneumonia and UTI. Sepsis physiology resolved. -WBC improving  Leg pain and edema -duplex r/o DVT--neg  Asthma/COPD exacerbation. - Improving. Continue prednisone-->wean -Continue bronchodilators  Hypokalemia/hypomagnesemia - Replaced. Check and replace magnesium.  GERD - PPI.  Elevated troponin - No chest pain reported. Flat trend. - Continue aspirin.  - 03/25/16-echo--EF 60-65%, grade 2 DD, no WMA  Essential hypertension - Reasonable inpatient control -continue metoprolol tartrate  Hyperlipidemia - Statins  Anxiety and depression - Continue Lexapro and Klonopin.  DVT prophylaxis: Lovenox Code Status: DNR Family Communication: Daughter updated at bedside 4/12 Disposition Plan: SNF likely 4/14 if stable     Procedures/Studies: Dg Chest 2 View  03/23/2016  CLINICAL DATA:  Dyspnea and tremors. EXAM: CHEST  2 VIEW  COMPARISON:  03/09/2016 FINDINGS: There is right middle lobe consolidation, likely pneumonia. No effusions. Normal pulmonary vasculature. The left lung is clear. Heart size is normal. IMPRESSION: Right middle lobe consolidation, likely pneumonia. Followup PA and lateral chest X-ray is recommended in 3-4 weeks following trial of antibiotic therapy to ensure resolution and exclude underlying malignancy. Electronically Signed   By: Andreas Newport M.D.   On: 03/23/2016 05:47   Dg Chest Port 1 View  03/25/2016  CLINICAL DATA:  Recent pneumonia.  Shortness of breath, weakness EXAM: PORTABLE CHEST 1 VIEW COMPARISON:  03/23/2016 FINDINGS: Bilateral lower lobe airspace opacities are noted, similar on the right prior study, increasing on the left since prior study. Heart is normal size. No visible effusions. No acute bony abnormality. IMPRESSION: Bilateral lower lobe pneumonia, worsening on the left since prior study. Electronically Signed   By: Rolm Baptise M.D.   On: 03/25/2016 16:41         Subjective: patient says that he is breathing better. Denies any fevers, chills, chest pain, nausea, vomiting, diarrhea, abdominal pain.  Objective: Filed Vitals:   03/26/16 0336 03/26/16 0615 03/26/16 1050 03/26/16 1500  BP:  169/90 161/78 149/71  Pulse:  80 88 76  Temp:  97.4 F (36.3 C)  98 F (36.7 C)  TempSrc:  Oral  Oral  Resp:  18  18  Height:      Weight:      SpO2: 90% 96%  96%    Intake/Output Summary (Last 24 hours) at 03/26/16 1759 Last data filed at 03/26/16 1221  Gross per 24 hour  Intake    660 ml  Output      0 ml  Net    660 ml   Weight change:  Exam:   General:  Pt is alert, follows commands appropriately, not in acute distress  HEENT: No icterus, No thrush, No neck mass, [place]/AT  Cardiovascular: RRR, S1/S2, no rubs, no gallops  Respiratory: bibasilar rales, right greater than left. No wheezing. Good air movement.  Abdomen: Soft/+BS, non tender, non distended, no  guarding  Extremities: No edema, No lymphangitis, No petechiae, No rashes, no synovitis  Data Reviewed: Basic Metabolic Panel:  Recent Labs Lab 03/23/16 0537 03/23/16 0852 03/24/16 0433 03/24/16 1908 03/26/16 0444  NA 142  --  143  --  144  K 3.2*  --  4.0  --  4.2  CL 104  --  112*  --  110  CO2 27  --  25  --  26  GLUCOSE 135*  --  94  --  101*  BUN 28*  --  19  --  18  CREATININE 1.08*  --  0.75  --  0.74  CALCIUM 9.1  --  8.1*  --  9.1  MG  --  1.5*  --  2.3  --    Liver Function Tests:  Recent Labs Lab 03/24/16 0433  AST 22  ALT 24  ALKPHOS 55  BILITOT 0.5  PROT 5.8*  ALBUMIN 2.7*   No results for input(s): LIPASE, AMYLASE in the last 168 hours. No results for input(s): AMMONIA in the last 168 hours. CBC:  Recent Labs Lab 03/23/16 0537 03/24/16 0433 03/26/16 0444  WBC 7.0 17.2* 11.2*  NEUTROABS 6.1  --  9.3*  HGB 15.1* 12.1 12.3  HCT 46.0 35.9* 37.2  MCV 87.1 87.1 89.2  PLT 202 188 215   Cardiac Enzymes:  Recent Labs Lab 03/23/16 0537 03/23/16 0852 03/23/16 1354  TROPONINI <0.03 0.14* 0.12*   BNP: Invalid input(s): POCBNP CBG: No results for input(s): GLUCAP in the last 168 hours.  Recent Results (from the past 240 hour(s))  Blood Culture (routine x 2)     Status: None (Preliminary result)   Collection Time: 03/23/16  5:30 AM  Result Value Ref Range Status   Specimen Description BLOOD LEFT FOREARM  Final   Special Requests BOTTLES DRAWN AEROBIC AND ANAEROBIC 5ML  Final   Culture   Final    NO GROWTH 3 DAYS Performed at [facility]    Report Status PENDING  Incomplete  Blood Culture (routine x 2)     Status: None (Preliminary result)   Collection Time: 03/23/16  5:34 AM  Result Value Ref Range Status   Specimen Description BLOOD RIGHT ANTECUBITAL  Final   Special Requests BOTTLES DRAWN AEROBIC AND ANAEROBIC 5ML  Final   Culture   Final    NO GROWTH 3 DAYS Performed at [facility]    Report Status PENDING   Incomplete  Urine culture     Status: Abnormal   Collection Time: 03/23/16  5:44 AM  Result Value Ref Range Status   Specimen Description URINE, CLEAN CATCH  Final   Special Requests NONE  Final   Culture >=100,000 COLONIES/mL ESCHERICHIA COLI (A)  Final   Report Status 03/25/2016 FINAL  Final   Organism ID, Bacteria ESCHERICHIA COLI (A)  Final      Susceptibility   Escherichia coli - MIC*    AMPICILLIN <=2 SENSITIVE Sensitive     CEFAZOLIN <=4 SENSITIVE Sensitive     CEFTRIAXONE <=1 SENSITIVE Sensitive     CIPROFLOXACIN <=0.25 SENSITIVE Sensitive     GENTAMICIN <=1 SENSITIVE Sensitive     IMIPENEM <=0.25 SENSITIVE Sensitive  NITROFURANTOIN <=16 SENSITIVE Sensitive     TRIMETH/SULFA <=20 SENSITIVE Sensitive     AMPICILLIN/SULBACTAM <=2 SENSITIVE Sensitive     PIP/TAZO <=4 SENSITIVE Sensitive     * >=100,000 COLONIES/mL ESCHERICHIA COLI  Respiratory virus panel     Status: None   Collection Time: 03/23/16  9:15 AM  Result Value Ref Range Status   Respiratory Syncytial Virus A Negative Negative Final   Respiratory Syncytial Virus B Negative Negative Final   Influenza A Negative Negative Final   Influenza B Negative Negative Final   Parainfluenza 1 Negative Negative Final   Parainfluenza 2 Negative Negative Final   Parainfluenza 3 Negative Negative Final   Metapneumovirus Negative Negative Final   Rhinovirus Negative Negative Final   Adenovirus Negative Negative Final    Comment: (NOTE) Performed At: [facility] [place], [place] [id number] Lindon Romp MD PV:[id number]      Scheduled Meds: . antiseptic oral rinse  7 mL Mouth Rinse q12n4p  . aspirin  325 mg Oral Daily  . azithromycin  500 mg Intravenous Q24H  . cefTRIAXone (ROCEPHIN)  IV  1 g Intravenous Q24H  . chlorhexidine  15 mL Mouth Rinse BID  . clonazePAM  0.25 mg Oral QHS  . enoxaparin (LOVENOX) injection  40 mg Subcutaneous Q24H  . escitalopram  5 mg Oral Daily  . gabapentin  100  mg Oral TID  . ipratropium-albuterol  3 mL Nebulization TID  . latanoprost  1 drop Both Eyes QHS  . metoprolol tartrate  25 mg Oral BID  . predniSONE  50 mg Oral Q breakfast  . simvastatin  10 mg Oral Once per day on Mon Wed Fri   Continuous Infusions:    Josean Lycan, DO  [facility] Pager [phone]  If 7PM-7AM, please contact night-coverage [email or web address] Password [email or web address] 03/26/2016, 5:59 PM   LOS: 3 days

## 2016-03-26 NOTE — Clinical Social Work Note (Signed)
Clinical Social Work Assessment  Patient Details  Name: Michaela Flynn MRN: [id number] Date of Birth: 1925-06-23  Date of referral:  03/26/16               Reason for consult:  Facility Placement                Permission sought to share information with:  Chartered certified accountant granted to share information::  Yes, Verbal Permission Granted  Name::        Agency::     Relationship::     Contact Information:     Housing/Transportation Living arrangements for the past 2 months:  Single Family Home Source of Information:  Adult Children Patient Interpreter Needed:  None Criminal Activity/Legal Involvement Pertinent to Current Situation/Hospitalization:  No - Comment as needed Significant Relationships:  Adult Children Lives with:  Self Do you feel safe going back to the place where you live?  No Need for family participation in patient care:  Yes (Comment)  Care giving concerns:  CSW reviewed PT evaluation recommending SNF at discharge.    Social Worker assessment / plan:  CSW spoke with patient's daughter, Michaela Flynn via phone (cell#: [phone]) re: discharge planning.   Employment status:  Retired Nurse, adult PT Recommendations:  [place] / Referral to community resources:  [place]  Patient/Family's Response to care:  Patient & children are agreeable with plan for SNF - CSW sent information out to [facility] and provided SNF bed offers at bedside. Patient's daughter to come by this evening to look over list and plans to tour [place] as it is the closest to where patient's children live.   Patient/Family's Understanding of and Emotional Response to Diagnosis, Current Treatment, and Prognosis:    Emotional Assessment Appearance:  Appears stated age Attitude/Demeanor/Rapport:    Affect (typically observed):    Orientation:  Oriented to Self, Oriented to Place, Oriented to   Time, Oriented to Situation Alcohol / Substance use:    Psych involvement (Current and /or in the community):     Discharge Needs  Concerns to be addressed:    Readmission within the last 30 days:    Current discharge risk:    Barriers to Discharge:      Standley Brooking, LCSW 03/26/2016, 1:05 PM

## 2016-03-26 NOTE — Progress Notes (Signed)
VASCULAR LAB PRELIMINARY  PRELIMINARY  PRELIMINARY  PRELIMINARY  Bilateral lower extremity venous duplex completed.    Preliminary report:  Bilateral:  No evidence of DVT, superficial thrombosis, or Baker's Cyst.   Katielynn Horan, RVS 03/26/2016, 2:59 PM

## 2016-03-26 NOTE — NC FL2 (Signed)
[place] LEVEL OF CARE SCREENING TOOL     IDENTIFICATION  Patient Name: Michaela Flynn Birthdate: March 26, 1925 Sex: female Admission Date (Current Location): 03/23/2016  [facility] and [place] Number:  Herbalist and Address:  [facility],  [place] [address], [place]      Provider Number: [id number]  Attending Physician Name and Address:  Orson Eva, MD  Relative Name and Phone Number:       Current Level of Care: Hospital Recommended Level of Care: [place] Prior Approval Number:    Date Approved/Denied:   PASRR Number: [id number]  Discharge Plan: SNF    Current Diagnoses: Patient Active Problem List   Diagnosis Date Noted  . Sepsis ([place]) 03/25/2016  . Leg pain   . CAP (community acquired pneumonia) 03/23/2016  . Community acquired pneumonia   . UTI (lower urinary tract infection)   . Aftercare following surgery of the circulatory system, [place] 07/10/2013  . Occlusion and stenosis of carotid artery without mention of cerebral infarction 12/05/2012    Orientation RESPIRATION BLADDER Height & Weight     Self, Time, Situation, Place  Normal Continent Weight: 180 lb (81.647 kg) Height:  5\' 4"  (162.6 cm)  BEHAVIORAL SYMPTOMS/MOOD NEUROLOGICAL BOWEL NUTRITION STATUS      Continent Diet (Heart Healthy)  AMBULATORY STATUS COMMUNICATION OF NEEDS Skin   Extensive Assist Verbally Normal                       Personal Care Assistance Level of Assistance  Bathing, Dressing Bathing Assistance: Limited assistance   Dressing Assistance: Limited assistance     Functional Limitations Info             SPECIAL CARE FACTORS FREQUENCY  PT (By licensed PT), OT (By licensed OT)     PT Frequency: 5 OT Frequency: 5            Contractures      Additional Factors Info  Code Status, Allergies Code Status Info: DNR Allergies Info: NKDA           Current Medications (03/26/2016):  This is the current  hospital active medication list Current Facility-Administered Medications  Medication Dose Route Frequency Provider Last Rate Last Dose  . acetaminophen (TYLENOL) tablet 650 mg  650 mg Oral Q6H PRN Reyne Dumas, MD      . albuterol (PROVENTIL) (2.5 MG/3ML) 0.083% nebulizer solution 2.5 mg  2.5 mg Nebulization Q4H PRN Modena Jansky, MD   2.5 mg at 03/26/16 0336  . antiseptic oral rinse (CPC / CETYLPYRIDINIUM CHLORIDE 0.05%) solution 7 mL  7 mL Mouth Rinse q12n4p Reyne Dumas, MD   7 mL at 03/25/16 1553  . aspirin tablet 325 mg  325 mg Oral Daily Reyne Dumas, MD   325 mg at 03/26/16 1050  . azithromycin (ZITHROMAX) 500 mg in dextrose 5 % 250 mL IVPB  500 mg Intravenous Q24H Reyne Dumas, MD   500 mg at 03/26/16 1004  . cefTRIAXone (ROCEPHIN) 1 g in dextrose 5 % 50 mL IVPB  1 g Intravenous Q24H Reyne Dumas, MD   1 g at 03/26/16 0916  . chlorhexidine (PERIDEX) 0.12 % solution 15 mL  15 mL Mouth Rinse BID Reyne Dumas, MD   15 mL at 03/26/16 1052  . clonazePAM (KLONOPIN) tablet 0.25 mg  0.25 mg Oral QHS Reyne Dumas, MD   0.25 mg at 03/25/16 2109  . enoxaparin (LOVENOX) injection 40 mg  40  mg Subcutaneous Q24H Reyne Dumas, MD   40 mg at 03/26/16 1052  . escitalopram (LEXAPRO) tablet 5 mg  5 mg Oral Daily Reyne Dumas, MD   5 mg at 03/26/16 1050  . gabapentin (NEURONTIN) capsule 100 mg  100 mg Oral TID Reyne Dumas, MD   100 mg at 03/26/16 1051  . ipratropium-albuterol (DUONEB) 0.5-2.5 (3) MG/3ML nebulizer solution 3 mL  3 mL Nebulization TID Modena Jansky, MD   3 mL at 03/26/16 0852  . latanoprost (XALATAN) 0.005 % ophthalmic solution 1 drop  1 drop Both Eyes QHS Reyne Dumas, MD   1 drop at 03/25/16 2112  . metoprolol tartrate (LOPRESSOR) tablet 25 mg  25 mg Oral BID Reyne Dumas, MD   25 mg at 03/26/16 1050  . ondansetron (ZOFRAN) injection 4 mg  4 mg Intravenous Q6H PRN Reyne Dumas, MD      . polyvinyl alcohol (LIQUIFILM TEARS) 1.4 % ophthalmic solution 1 drop  1 drop Both Eyes QID PRN  Reyne Dumas, MD   1 drop at 03/25/16 0614  . predniSONE (DELTASONE) tablet 50 mg  50 mg Oral Q breakfast Reyne Dumas, MD   50 mg at 03/26/16 0918  . simvastatin (ZOCOR) tablet 10 mg  10 mg Oral Once per day on Mon Wed Fri Reyne Dumas, MD   10 mg at 03/25/16 1019  . traMADol (ULTRAM) tablet 50 mg  50 mg Oral Q6H PRN Reyne Dumas, MD   50 mg at 03/26/16 1007     Discharge Medications: Please see discharge summary for a list of discharge medications.  Relevant Imaging Results:  Relevant Lab Results:   Additional Information SSN: [id number]  Standley Brooking, LCSW

## 2016-03-26 NOTE — Clinical Social Work Placement (Signed)
   CLINICAL SOCIAL WORK PLACEMENT  NOTE  Date:  03/26/2016  Patient Details  Name: Michaela Flynn MRN: [id number] Date of Birth: 1925/02/07  Clinical Social Work is seeking post-discharge placement for this patient at the [place] level of care (*CSW will initial, date and re-position this form in  chart as items are completed):  Yes   Patient/family provided with [place] Work Department's list of facilities offering this level of care within the geographic area requested by the patient (or if unable, by the patient's family).  Yes   Patient/family informed of their freedom to choose among providers that offer the needed level of care, that participate in Medicare, Medicaid or managed care program needed by the patient, have an available bed and are willing to accept the patient.  Yes   Patient/family informed of [place]'s ownership interest in [facility] and [facility], as well as of the fact that they are under no obligation to receive care at these facilities.  PASRR submitted to EDS on 03/26/16     PASRR number received on 03/26/16     Existing PASRR number confirmed on       FL2 transmitted to all facilities in geographic area requested by pt/family on 03/26/16     FL2 transmitted to all facilities within larger geographic area on       Patient informed that his/her managed care company has contracts with or will negotiate with certain facilities, including the following:            Patient/family informed of bed offers received.  Patient chooses bed at       Physician recommends and patient chooses bed at      Patient to be transferred to   on  .  Patient to be transferred to facility by       Patient family notified on   of transfer.  Name of family member notified:        PHYSICIAN       Additional Comment:    _______________________________________________ Standley Brooking, LCSW 03/26/2016, 12:13 PM

## 2016-03-26 NOTE — Care Management Important Message (Signed)
Important Message  Patient Details  Name: Geralyn Maltos MRN: [id number] Date of Birth: Aug 24, 1925   Medicare Important Message Given:  Yes    Camillo Flaming 03/26/2016, 9:14 AMImportant Message  Patient Details  Name: Louvenia Doss MRN: [id number] Date of Birth: 04-11-1925   Medicare Important Message Given:  Yes    Camillo Flaming 03/26/2016, 9:14 AM

## 2016-03-27 MED ORDER — TRAMADOL HCL 50 MG PO TABS: 50.0000 mg | ORAL_TABLET | Freq: Four times a day (QID) | ORAL | Status: AC | PRN

## 2016-03-27 MED ORDER — CLONAZEPAM 0.5 MG PO TABS: 0.2500 mg | ORAL_TABLET | Freq: Every day | ORAL | Status: DC

## 2016-03-27 MED ORDER — AMLODIPINE BESYLATE 5 MG PO TABS: 2.5000 mg | ORAL_TABLET | Freq: Every day | ORAL | Status: DC

## 2016-03-27 MED ORDER — CEFUROXIME AXETIL 500 MG PO TABS
500.0000 mg | ORAL_TABLET | Freq: Two times a day (BID) | ORAL | Status: DC
Start: 2016-03-28 — End: 2016-03-27
  Filled 2016-03-27: qty 1

## 2016-03-27 MED ORDER — AMLODIPINE BESYLATE 2.5 MG PO TABS: 2.5000 mg | ORAL_TABLET | Freq: Every day | ORAL | Status: DC

## 2016-03-27 MED ORDER — ALBUTEROL SULFATE HFA 108 (90 BASE) MCG/ACT IN AERS
2.0000 | INHALATION_SPRAY | Freq: Four times a day (QID) | RESPIRATORY_TRACT | Status: AC | PRN
Start: 2016-03-27 — End: ?

## 2016-03-27 MED ORDER — HYDRALAZINE HCL 20 MG/ML IJ SOLN
10.0000 mg | Freq: Four times a day (QID) | INTRAMUSCULAR | Status: DC | PRN
  Administered 2016-03-27: 10 mg via INTRAVENOUS
  Filled 2016-03-27: qty 1

## 2016-03-27 MED ORDER — CEFUROXIME AXETIL 500 MG PO TABS: 500.0000 mg | ORAL_TABLET | Freq: Two times a day (BID) | ORAL | Status: DC

## 2016-03-27 MED ORDER — AZITHROMYCIN 500 MG PO TABS: 500.0000 mg | ORAL_TABLET | Freq: Every day | ORAL | Status: DC

## 2016-03-27 MED ORDER — AMLODIPINE BESYLATE 5 MG PO TABS
5.0000 mg | ORAL_TABLET | Freq: Every day | ORAL | Status: DC
Start: 2016-03-27 — End: 2016-03-27

## 2016-03-27 MED ORDER — AZITHROMYCIN 250 MG PO TABS: 500.0000 mg | ORAL_TABLET | Freq: Every day | ORAL | Status: DC

## 2016-03-27 MED ORDER — DIPHENHYDRAMINE HCL 25 MG PO CAPS
25.0000 mg | ORAL_CAPSULE | Freq: Once | ORAL | Status: AC
  Administered 2016-03-27: 25 mg via ORAL
  Filled 2016-03-27: qty 1

## 2016-03-27 NOTE — Progress Notes (Signed)
Patient's B/P 194/85. PCP on call notified.

## 2016-03-27 NOTE — Progress Notes (Signed)
Physical Therapy Treatment Patient Details Name: Michaela Flynn MRN: [id number] DOB: Dec 30, 1924 Today's Date: 03/27/2016    History of Present Illness 82 year old female with PMH of extreme hard of hearing, HLD, HTN, anxiety, depression, GERD and admitted for CAP    PT Comments    Pt OOB in recliner on 2 lts.  Assisted to [facility] then amb in hallway.   Follow Up Recommendations  SNF     Equipment Recommendations  None recommended by PT    Recommendations for Other Services       Precautions / Restrictions Precautions Precautions: Fall Precaution Comments: monitor sats    Mobility  Bed Mobility               General bed mobility comments: Pt OOB in recliner   Transfers Overall transfer level: Needs assistance Equipment used: Rolling walker (2 wheeled) Transfers: Sit to/from Stand Sit to Stand: Min assist;Min guard         General transfer comment: multimodal cues (since pt HOH) for technique, assist to rise and steady  Ambulation/Gait Ambulation/Gait assistance: Min assist;Min guard Ambulation Distance (Feet): 65 Feet Assistive device: Rolling walker (2 wheeled) Gait Pattern/deviations: Step-through pattern;Decreased stride length;Trunk flexed     General Gait Details: amb on 2 lts increased distance   Science writer    Modified Rankin (Stroke Patients Only)       Balance                                    Cognition Arousal/Alertness: Awake/alert Behavior During Therapy: WFL for tasks assessed/performed Overall Cognitive Status: Within Functional Limits for tasks assessed                      Exercises      General Comments        Pertinent Vitals/Pain Pain Assessment: No/denies pain    Home Living                      Prior Function            PT Goals (current goals can now be found in the care plan section) Progress towards PT goals: Progressing toward goals     Frequency  Min 3X/week    PT Plan      Co-evaluation             End of Session Equipment Utilized During Treatment: Gait belt Activity Tolerance: Patient limited by fatigue Patient left: in chair;with call bell/phone within reach     Time: 1100-1130 PT Time Calculation (min) (ACUTE ONLY): 30 min  Charges:  $Gait Training: 8-22 mins $Therapeutic Activity: 8-22 mins                    G Codes:      Rica Koyanagi  PTA [facility]  Acute  Rehab Pager      [phone]

## 2016-03-27 NOTE — Clinical Social Work Placement (Signed)
Patient is set to discharge to [facility] today. Patient & son, Louie Casa made aware. Discharge packet given to RN, Hinton Dyer. [facility] called for transport.     Raynaldo Opitz, [facility] Clinical Social Worker cell #: [phone]    CLINICAL SOCIAL WORK PLACEMENT  NOTE  Date:  03/27/2016  Patient Details  Name: Michaela Flynn MRN: [id number] Date of Birth: Feb 23, 1925  Clinical Social Work is seeking post-discharge placement for this patient at the [place] level of care (*CSW will initial, date and re-position this form in  chart as items are completed):  Yes   Patient/family provided with [place] Work Department's list of facilities offering this level of care within the geographic area requested by the patient (or if unable, by the patient's family).  Yes   Patient/family informed of their freedom to choose among providers that offer the needed level of care, that participate in Medicare, Medicaid or managed care program needed by the patient, have an available bed and are willing to accept the patient.  Yes   Patient/family informed of [place]'s ownership interest in [facility] and [facility], as well as of the fact that they are under no obligation to receive care at these facilities.  PASRR submitted to EDS on 03/26/16     PASRR number received on 03/26/16     Existing PASRR number confirmed on       FL2 transmitted to all facilities in geographic area requested by pt/family on 03/26/16     FL2 transmitted to all facilities within larger geographic area on       Patient informed that his/her managed care company has contracts with or will negotiate with certain facilities, including the following:        Yes   Patient/family informed of bed offers received.  Patient chooses bed at [facility]     Physician recommends and patient chooses bed at      Patient to be transferred to [facility] on 03/27/16.  Patient to be transferred to facility by PT[facility]     Patient family notified on 03/27/16 of transfer.  Name of family member notified:  patient's son, Louie Casa via phone     PHYSICIAN       Additional Comment:    _______________________________________________ Standley Brooking, LCSW 03/27/2016, 2:23 PM

## 2016-03-27 NOTE — Discharge Summary (Signed)
Physician Discharge Summary  Michaela Flynn ATF:[id number] DOB: 1925/01/30 DOA: 03/23/2016  PCP: Michaela Gravel, MD  Admit date: 03/23/2016 Discharge date: 03/27/2016  Recommendations for Outpatient Follow-up:  1. Pt will need to follow up with PCP in 2 weeks post discharge 2. Please obtain BMP in one week 3. Maintain 2L nasal canula;  Wean off  for oxygen saturation >92%  Discharge Diagnoses:  Community-acquired pneumonia, RML - Initiated on IV azithromycin and ceftriaxone - Urine streptococcal antigen: Negative. Urine Legionella antigen: Negative. Lactate has normalized.  -RSV panel: Pending. Influenza panel PCR: Negative.  -HIV antibodies: Nonreactive.  -Blood cultures 2: Negative to date. -03/25/2016--chest x-ray bilateral lower lobe opacities, worsening on the left -discharge with cefuroxime and azithromycin x 2 more days to complete 7 days of therapy   E Coli UTI - Continue IV Rocephin for now in setting of CAP - plan to d/c with cefuroxime as discussed above  Sepsis - Met sepsis criteria on admission. Secondary to pneumonia and UTI. Sepsis physiology resolved. -WBC improving  Leg pain and edema -duplex r/o DVT--neg  Asthma/COPD exacerbation. - Improving. Continue prednisone-->completed 5 day course. -improved -Continue bronchodilators -d/c with rescue albuterol MDI  Hypokalemia/hypomagnesemia - Replaced. Check and replace magnesium.  GERD - PPI.  Elevated troponin - No chest pain reported. Flat trend. - Continue aspirin.  - 03/25/16-echo--EF 60-65%, grade 2 DD, no WMA  Essential hypertension - Reasonable inpatient control -continue metoprolol tartrate - hold benazepril/HCTZ due to dehydration and worsen renal function - home with amlodipine 2.5 mg daily  Hyperlipidemia - Statins  Anxiety and depression - Continue Lexapro and Klonopin.  Discharge Condition: stable  Disposition: SNF  Diet: heart healthy Wt Readings from Last 3 Encounters:  03/23/16  81.647 kg (180 lb)  02/03/16 85.276 kg (188 lb)  07/10/13 72.122 kg (159 lb)    History of present illness:  82 year old female with PMH of extreme hard of hearing, HLD, HTN, anxiety, depression, GERD presented to ED with complaints of fever, cough, wheezing, dyspnea. In the ED, temperature of 101.6, wheezing and chest x-ray showed RML pneumonia. Treated per sepsis protocol and admitted for community-acquired pneumonia.  The patient was treated with ceftriaxone and intravenous azithromycin. She clinically improved. She defervesced and remained hemodynamically stable. The patient's wheezing improved with prednisone. She finished 5 day course of prednisone during the hospitalization. She'll be discharged with cefuroxime and azithromycin for 2 additional days to complete 1 week of therapy.  Discharge Exam: Filed Vitals:   03/27/16 0616 03/27/16 1152  BP: 194/85 118/54  Pulse: 75 80  Temp: 97.7 F (36.5 C)   Resp: 18    Filed Vitals:   03/26/16 2132 03/26/16 2203 03/27/16 0616 03/27/16 1152  BP: 194/90  194/85 118/54  Pulse: 76  75 80  Temp: 97.5 F (36.4 C)  97.7 F (36.5 C)   TempSrc: Oral  Oral   Resp: 18  18   Height:      Weight:      SpO2: 99% 98% 96%    General: Awake and alert, NAD, pleasant, cooperative Cardiovascular: RRR, no rub, no gallop, no S3 Respiratory: Bibasilar rales. No wheezing. Good air movement. Abdomen:soft, nontender, nondistended, positive bowel sounds Extremities: 1+ LE edema, No lymphangitis, no petechiae  Discharge Instructions      Discharge Instructions    Diet - low sodium heart healthy    Complete by:  As directed      Increase activity slowly    Complete by:  As directed  Medication List    STOP taking these medications        benazepril-hydrochlorthiazide 20-12.5 MG tablet  Commonly known as:  LOTENSIN HCT     diphenhydrAMINE 25 MG tablet  Commonly known as:  BENADRYL      TAKE these medications        albuterol  108 (90 Base) MCG/ACT inhaler  Commonly known as:  PROVENTIL HFA;VENTOLIN HFA  Inhale 2 puffs into the lungs every 6 (six) hours as needed for wheezing or shortness of breath.     amLODipine 2.5 MG tablet  Commonly known as:  NORVASC  Take 1 tablet (2.5 mg total) by mouth daily.  Start taking on:  03/28/2016     aspirin 325 MG tablet  Take 325 mg by mouth daily.     azithromycin 500 MG tablet  Commonly known as:  ZITHROMAX  Take 1 tablet (500 mg total) by mouth daily. Last dose on 03/29/16  Start taking on:  03/28/2016     cefUROXime 500 MG tablet  Commonly known as:  CEFTIN  Take 1 tablet (500 mg total) by mouth 2 (two) times daily with a meal. Last doses on 03/29/16  Start taking on:  03/28/2016     clonazePAM 0.5 MG tablet  Commonly known as:  KLONOPIN  Take 0.25 mg by mouth at bedtime. For anxiety     escitalopram 10 MG tablet  Commonly known as:  LEXAPRO  Take 5 mg by mouth daily.     gabapentin 100 MG capsule  Commonly known as:  NEURONTIN  Take 100 mg by mouth 3 (three) times daily.     metoprolol tartrate 25 MG tablet  Commonly known as:  LOPRESSOR  Take 25 mg by mouth 2 (two) times daily.     simvastatin 10 MG tablet  Commonly known as:  ZOCOR  Take 10 mg by mouth 3 (three) times a week.     SYSTANE PRESERVATIVE FREE 0.4-0.3 % Soln  Generic drug:  Polyethyl Glycol-Propyl Glycol  Place 1 drop into both eyes 2 (two) times daily. For dry eyes     traMADol 50 MG tablet  Commonly known as:  ULTRAM  Take 1 tablet (50 mg total) by mouth every 6 (six) hours as needed. For pain     travoprost (benzalkonium) 0.004 % ophthalmic solution  Commonly known as:  TRAVATAN  Place 1 drop into both eyes at bedtime.         The results of significant diagnostics from this hospitalization (including imaging, microbiology, ancillary and laboratory) are listed below for reference.    Significant Diagnostic Studies: Dg Chest 2 View  03/23/2016  CLINICAL DATA:  Dyspnea and  tremors. EXAM: CHEST  2 VIEW COMPARISON:  03/09/2016 FINDINGS: There is right middle lobe consolidation, likely pneumonia. No effusions. Normal pulmonary vasculature. The left lung is clear. Heart size is normal. IMPRESSION: Right middle lobe consolidation, likely pneumonia. Followup PA and lateral chest X-ray is recommended in 3-4 weeks following trial of antibiotic therapy to ensure resolution and exclude underlying malignancy. Electronically Signed   By: Andreas Newport M.D.   On: 03/23/2016 05:47   Dg Chest Port 1 View  03/25/2016  CLINICAL DATA:  Recent pneumonia.  Shortness of breath, weakness EXAM: PORTABLE CHEST 1 VIEW COMPARISON:  03/23/2016 FINDINGS: Bilateral lower lobe airspace opacities are noted, similar on the right prior study, increasing on the left since prior study. Heart is normal size. No visible effusions. No acute bony abnormality. IMPRESSION: Bilateral lower  lobe pneumonia, worsening on the left since prior study. Electronically Signed   By: Rolm Baptise M.D.   On: 03/25/2016 16:41     Microbiology: Recent Results (from the past 240 hour(s))  Blood Culture (routine x 2)     Status: None (Preliminary result)   Collection Time: 03/23/16  5:30 AM  Result Value Ref Range Status   Specimen Description BLOOD LEFT FOREARM  Final   Special Requests BOTTLES DRAWN AEROBIC AND ANAEROBIC 5ML  Final   Culture   Final    NO GROWTH 3 DAYS Performed at [facility]    Report Status PENDING  Incomplete  Blood Culture (routine x 2)     Status: None (Preliminary result)   Collection Time: 03/23/16  5:34 AM  Result Value Ref Range Status   Specimen Description BLOOD RIGHT ANTECUBITAL  Final   Special Requests BOTTLES DRAWN AEROBIC AND ANAEROBIC 5ML  Final   Culture   Final    NO GROWTH 3 DAYS Performed at [facility]    Report Status PENDING  Incomplete  Urine culture     Status: Abnormal   Collection Time: 03/23/16  5:44 AM  Result Value Ref Range Status    Specimen Description URINE, CLEAN CATCH  Final   Special Requests NONE  Final   Culture >=100,000 COLONIES/mL ESCHERICHIA COLI (A)  Final   Report Status 03/25/2016 FINAL  Final   Organism ID, Bacteria ESCHERICHIA COLI (A)  Final      Susceptibility   Escherichia coli - MIC*    AMPICILLIN <=2 SENSITIVE Sensitive     CEFAZOLIN <=4 SENSITIVE Sensitive     CEFTRIAXONE <=1 SENSITIVE Sensitive     CIPROFLOXACIN <=0.25 SENSITIVE Sensitive     GENTAMICIN <=1 SENSITIVE Sensitive     IMIPENEM <=0.25 SENSITIVE Sensitive     NITROFURANTOIN <=16 SENSITIVE Sensitive     TRIMETH/SULFA <=20 SENSITIVE Sensitive     AMPICILLIN/SULBACTAM <=2 SENSITIVE Sensitive     PIP/TAZO <=4 SENSITIVE Sensitive     * >=100,000 COLONIES/mL ESCHERICHIA COLI  Respiratory virus panel     Status: None   Collection Time: 03/23/16  9:15 AM  Result Value Ref Range Status   Respiratory Syncytial Virus A Negative Negative Final   Respiratory Syncytial Virus B Negative Negative Final   Influenza A Negative Negative Final   Influenza B Negative Negative Final   Parainfluenza 1 Negative Negative Final   Parainfluenza 2 Negative Negative Final   Parainfluenza 3 Negative Negative Final   Metapneumovirus Negative Negative Final   Rhinovirus Negative Negative Final   Adenovirus Negative Negative Final    Comment: (NOTE) Performed At: [facility] [place], [place] [id number] Lindon Romp MD FO:[id number]      Labs: Basic Metabolic Panel:  Recent Labs Lab 03/23/16 0537 03/23/16 0852 03/24/16 0433 03/24/16 1908 03/26/16 0444  NA 142  --  143  --  144  K 3.2*  --  4.0  --  4.2  CL 104  --  112*  --  110  CO2 27  --  25  --  26  GLUCOSE 135*  --  94  --  101*  BUN 28*  --  19  --  18  CREATININE 1.08*  --  0.75  --  0.74  CALCIUM 9.1  --  8.1*  --  9.1  MG  --  1.5*  --  2.3  --    Liver Function Tests:  Recent  Labs Lab 03/24/16 0433  AST 22  ALT 24  ALKPHOS 55  BILITOT 0.5    PROT 5.8*  ALBUMIN 2.7*   No results for input(s): LIPASE, AMYLASE in the last 168 hours. No results for input(s): AMMONIA in the last 168 hours. CBC:  Recent Labs Lab 03/23/16 0537 03/24/16 0433 03/26/16 0444  WBC 7.0 17.2* 11.2*  NEUTROABS 6.1  --  9.3*  HGB 15.1* 12.1 12.3  HCT 46.0 35.9* 37.2  MCV 87.1 87.1 89.2  PLT 202 188 215   Cardiac Enzymes:  Recent Labs Lab 03/23/16 0537 03/23/16 0852 03/23/16 1354  TROPONINI <0.03 0.14* 0.12*   BNP: Invalid input(s): POCBNP CBG: No results for input(s): GLUCAP in the last 168 hours.  Time coordinating discharge:  Greater than 30 minutes  Signed:  Elester Apodaca, DO [facility] Pager: [phone] 03/27/2016, 12:57 PM

## 2016-03-28 LAB — CULTURE, BLOOD (ROUTINE X 2)
CULTURE: NO GROWTH
Culture: NO GROWTH

## 2016-04-09 ENCOUNTER — Encounter: Payer: Self-pay | Admitting: Vascular Surgery

## 2016-04-14 ENCOUNTER — Encounter: Payer: Medicare Other | Admitting: Vascular Surgery

## 2016-04-17 ENCOUNTER — Encounter: Payer: Self-pay | Admitting: Surgery

## 2016-04-27 ENCOUNTER — Ambulatory Visit (HOSPITAL_COMMUNITY)
Admission: RE | Admit: 2016-04-27 | Discharge: 2016-04-27 | Disposition: A | Discharge status: Home / Self Care | Payer: Medicare Other | Source: Ambulatory Visit | Attending: Surgery | Admitting: Surgery

## 2016-04-27 ENCOUNTER — Ambulatory Visit (INDEPENDENT_AMBULATORY_CARE_PROVIDER_SITE_OTHER): Payer: Medicare Other | Admitting: Surgery

## 2016-04-27 ENCOUNTER — Encounter: Payer: Self-pay | Admitting: Surgery

## 2016-04-27 VITALS — BP 115/67 | HR 69 | Temp 97.9°F | Resp 14

## 2016-04-27 DIAGNOSIS — E785 Hyperlipidemia, unspecified: Secondary | ICD-10-CM | POA: Diagnosis not present

## 2016-04-27 DIAGNOSIS — M7989 Other specified soft tissue disorders: Secondary | ICD-10-CM | POA: Insufficient documentation

## 2016-04-27 DIAGNOSIS — I10 Essential (primary) hypertension: Secondary | ICD-10-CM | POA: Insufficient documentation

## 2016-04-27 NOTE — Progress Notes (Signed)
[facility]  Patient name: Michaela Flynn MRN: [id number] DOB: 1925/05/21 Sex: female  REASON FOR VISIT: Leg swelling  HPI: Michaela Flynn is a 82 y.o. female who is well known to me, having undergone right carotid endarterectomy on 12/23/2012 for symptomatic right carotid stenosis.  This caused her to have several falls and slurred speech.  The patient was recently hospitalized with pneumonia and then [facility].  She also had a UTI in the hospital.  She has been complaining of bilateral leg swelling, left greater than right, which she associates with her physical therapy.  It does go down with elevation.  It is very aggravating.  She has tried Lasix but does not like to take this because it makes her get up to go to the bathroom.  She does not have any sores or wounds on her feet.  Past Medical History  Diagnosis Date  . Hyperlipidemia   . Hypertension   . Anxiety and depression   . Glaucoma   . Abnormal LFTs     secondary to cholesterol  medication  . Fibrocystic breast disease   . Diverticulitis   . Shingles 2010  . S/P tonsillectomy and adenoidectomy 1975  . Cancer [facility]) 1972    Nonmelanoma resection  . Arthritis     Lower back," Osteo"  . Carotid artery occlusion     Bruit  . PONV (postoperative nausea and vomiting)     Family History  Problem Relation Age of Onset  . Stroke Father   . Heart attack Father     SOCIAL HISTORY: Social History  Substance Use Topics  . Smoking status: Never Smoker   . Smokeless tobacco: Never Used  . Alcohol Use: No    No Known Allergies  Current Outpatient Prescriptions  Medication Sig Dispense Refill  . albuterol (PROVENTIL HFA;VENTOLIN HFA) 108 (90 Base) MCG/ACT inhaler Inhale 2 puffs into the lungs every 6 (six) hours as needed for wheezing or shortness of breath. 1 Inhaler 2  . amLODipine (NORVASC) 2.5 MG tablet Take 1 tablet (2.5 mg total) by mouth daily. 30 tablet 0   . aspirin 325 MG tablet Take 325 mg by mouth daily.    . clonazePAM (KLONOPIN) 0.5 MG tablet Take 0.5 tablets (0.25 mg total) by mouth at bedtime. 30 tablet 0  . escitalopram (LEXAPRO) 10 MG tablet Take 5 mg by mouth daily.    . furosemide (LASIX) 20 MG tablet Take 20 mg by mouth.    . gabapentin (NEURONTIN) 100 MG capsule Take 100 mg by mouth 3 (three) times daily.     . metoprolol tartrate (LOPRESSOR) 25 MG tablet Take 25 mg by mouth 2 (two) times daily.    Vladimir Faster Glycol-Propyl Glycol (SYSTANE PRESERVATIVE FREE) 0.4-0.3 % SOLN Place 1 drop into both eyes 2 (two) times daily. For dry eyes    . simvastatin (ZOCOR) 10 MG tablet Take 10 mg by mouth 3 (three) times a week.    . travoprost, benzalkonium, (TRAVATAN) 0.004 % ophthalmic solution Place 1 drop into both eyes at bedtime.     . benazepril-hydrochlorthiazide (LOTENSIN HCT) 20-12.5 MG tablet Take 1 tablet by mouth daily.  4  . traMADol (ULTRAM) 50 MG tablet Take 1 tablet (50 mg total) by mouth every 6 (six) hours as needed. For pain (Patient not taking: Reported on 04/27/2016) 30 tablet 0   No current facility-administered medications for this visit.    REVIEW OF SYSTEMS:  [X]  denotes  positive finding, [ ]  denotes negative finding Cardiac  Comments:  Chest pain or chest pressure:    Shortness of breath upon exertion:    Short of breath when lying flat:    Irregular heart rhythm:        Vascular    Pain in calf, thigh, or hip brought on by ambulation:    Pain in feet at night that wakes you up from your sleep:     Blood clot in your veins:    Leg swelling:         Pulmonary    Oxygen at home:    Productive cough:     Wheezing:         Neurologic    Sudden weakness in arms or legs:     Sudden numbness in arms or legs:     Sudden onset of difficulty speaking or slurred speech:    Temporary loss of vision in one eye:     Problems with dizziness:         Gastrointestinal    Blood in stool:     Vomited blood:           Genitourinary    Burning when urinating:     Blood in urine:        Psychiatric    Major depression:         Hematologic    Bleeding problems:    Problems with blood clotting too easily:        Skin    Rashes or ulcers:        Constitutional    Fever or chills:      PHYSICAL EXAM: Filed Vitals:   04/27/16 1445  BP: 115/67  Pulse: 69  Temp: 97.9 F (36.6 C)  TempSrc: Oral  Resp: 14  SpO2: 94%    GENERAL: The patient is a well-nourished female, in no acute distress. The vital signs are documented above. CARDIAC: There is a regular rate and rhythm.  VASCULAR: 2-2 plus pitting edema bilaterally, left greater than right.  Palpable left dorsalis pedis pulse.  I cannot palpate the right PULMONARY: There is good air exchange bilaterally without wheezing or rales. ABDOMEN: Soft and non-tender with normal pitched bowel sounds.  MUSCULOSKELETAL: There are no major deformities or cyanosis. NEUROLOGIC: No focal weakness or paresthesias are detected. SKIN: There are no ulcers or rashes noted. PSYCHIATRIC: The patient has a normal affect.  DATA:  I have evaluated her arterial Doppler studies that were an outside report which shows mild vascular disease bilaterally.   Specifically, there was moderate inflow stenosis on the left and decreased velocity in the left common femoral artery.  There was moderate stenosis within the right popliteal artery  I ordered a venous ultrasound in the office today here.  This was a difficult evaluation because the patient could not stand.  On limited imaging, there was no obvious evidence of DVT  MEDICAL ISSUES: Bilateral leg swelling, left greater than right.  The patient had a limited ultrasound today which was negative for DVT.  I stressed the importance of elevation, compression, and the use of diuretics.  I have given him information to get compression stockings.  We discussed getting a low-grade compression (15-20), as a not sure if the patient  can tolerate a tighter level of compression given her prior surgeries for skin cancer in this area.  Carotid stenosis: Continuous routine surveillance and follow-up    Aline Wesche, [place] [facility] [employer]  Beeper: [phone]

## 2016-06-23 ENCOUNTER — Emergency Department (HOSPITAL_COMMUNITY)
Admission: EM | Admit: 2016-06-23 | Discharge: 2016-06-23 | Disposition: A | Discharge status: Home / Self Care | Payer: Medicare Other | Attending: Emergency Medicine | Admitting: Emergency Medicine

## 2016-06-23 ENCOUNTER — Encounter (HOSPITAL_COMMUNITY): Payer: Self-pay | Admitting: Emergency Medicine

## 2016-06-23 DIAGNOSIS — Z79899 Other long term (current) drug therapy: Secondary | ICD-10-CM | POA: Diagnosis not present

## 2016-06-23 DIAGNOSIS — Z85828 Personal history of other malignant neoplasm of skin: Secondary | ICD-10-CM | POA: Diagnosis not present

## 2016-06-23 DIAGNOSIS — R350 Frequency of micturition: Secondary | ICD-10-CM

## 2016-06-23 DIAGNOSIS — M479 Spondylosis, unspecified: Secondary | ICD-10-CM | POA: Insufficient documentation

## 2016-06-23 DIAGNOSIS — E785 Hyperlipidemia, unspecified: Secondary | ICD-10-CM | POA: Diagnosis not present

## 2016-06-23 DIAGNOSIS — Z7982 Long term (current) use of aspirin: Secondary | ICD-10-CM | POA: Diagnosis not present

## 2016-06-23 DIAGNOSIS — J441 Chronic obstructive pulmonary disease with (acute) exacerbation: Secondary | ICD-10-CM | POA: Insufficient documentation

## 2016-06-23 DIAGNOSIS — I10 Essential (primary) hypertension: Secondary | ICD-10-CM | POA: Insufficient documentation

## 2016-06-23 DIAGNOSIS — Z8679 Personal history of other diseases of the circulatory system: Secondary | ICD-10-CM | POA: Diagnosis not present

## 2016-06-23 LAB — URINALYSIS, ROUTINE W REFLEX MICROSCOPIC
Bilirubin Urine: NEGATIVE
Glucose, UA: NEGATIVE mg/dL
Hgb urine dipstick: NEGATIVE
Ketones, ur: NEGATIVE mg/dL
Nitrite: NEGATIVE
Protein, ur: NEGATIVE mg/dL
Specific Gravity, Urine: 1.007 (ref 1.005–1.030)
pH: 6.5 (ref 5.0–8.0)

## 2016-06-23 LAB — URINE MICROSCOPIC-ADD ON

## 2016-06-23 LAB — I-STAT CHEM 8, ED
BUN: 14 mg/dL (ref 6–20)
CHLORIDE: 102 mmol/L (ref 101–111)
Calcium, Ion: 1.21 mmol/L (ref 1.12–1.23)
Creatinine, Ser: 1 mg/dL (ref 0.44–1.00)
Glucose, Bld: 99 mg/dL (ref 65–99)
HEMATOCRIT: 39 % (ref 36.0–46.0)
Hemoglobin: 13.3 g/dL (ref 12.0–15.0)
POTASSIUM: 3.6 mmol/L (ref 3.5–5.1)
SODIUM: 143 mmol/L (ref 135–145)
TCO2: 30 mmol/L (ref 0–100)

## 2016-06-23 MED ORDER — GABAPENTIN 100 MG PO CAPS
100.0000 mg | ORAL_CAPSULE | Freq: Once | ORAL | Status: AC
  Administered 2016-06-23: 100 mg via ORAL
  Filled 2016-06-23: qty 1

## 2016-06-23 NOTE — ED Notes (Signed)
Bed: WA17 Expected date:  Expected time:  Means of arrival:  Comments: EMS 

## 2016-06-23 NOTE — Discharge Instructions (Signed)

## 2016-06-23 NOTE — ED Provider Notes (Signed)
Emergency Department Provider Note  Time seen: Approximately 5:08 PM  I have reviewed the triage vital signs and the nursing notes.   HISTORY  Chief Complaint No chief complaint on file.   HPI Michaela Flynn is a 82 y.o. female with past medical history of hypertension, hyperlipidemia, and anxiety presents to the emergency department with asymptomatic hypertension. The daughter bedside states she checks her blood pressure at lunch every day and for the past 2 days has been elevated. Patient has no associated complaints such as chest pain, lightheadedness, confusion or palpitations. She has been compliant with her home blood pressure medications. Recently amlodipine was discontinued because of leg swelling. Leg swelling since resolved. No associated infection symptoms. The patient has close follow-up with their primary care physician.   Past Medical History  Diagnosis Date  . Hyperlipidemia   . Hypertension   . Anxiety and depression   . Glaucoma   . Abnormal LFTs     secondary to cholesterol  medication  . Fibrocystic breast disease   . Diverticulitis   . Shingles 2010  . S/P tonsillectomy and adenoidectomy 1975  . Cancer [facility]) 1972    Nonmelanoma resection  . Arthritis     Lower back," Osteo"  . Carotid artery occlusion     Bruit  . PONV (postoperative nausea and vomiting)     Patient Active Problem List   Diagnosis Date Noted  . COPD exacerbation ([place]) 03/26/2016  . Sepsis ([place]) 03/25/2016  . Leg pain   . CAP (community acquired pneumonia) 03/23/2016  . Community acquired pneumonia   . UTI (lower urinary tract infection)   . Aftercare following surgery of the circulatory system, [place] 07/10/2013  . Occlusion and stenosis of carotid artery without mention of cerebral infarction 12/05/2012    Past Surgical History  Procedure Laterality Date  . Cholecystectomy  1956  . Eye surgery      Cataract  . Appendectomy  1956  . Colonoscopy  2008    [facility] [place]    . Esophagogastroduodenoscopy      ?  Zenker's per the patient  . Toe surgery      Onychomycosis  . Tonsillectomy and adenoidectomy    . Abdominal hysterectomy  1980's  . Endarterectomy  12/23/2012    Procedure: ENDARTERECTOMY CAROTID;  Surgeon: Serafina Mitchell, MD;  Location: [place];  Service: Vascular;  Laterality: Right;  . Patch angioplasty  12/23/2012    Procedure: PATCH ANGIOPLASTY;  Surgeon: Serafina Mitchell, MD;  Location: [facility] OR;  Service: Vascular;  Laterality: Right;  Using 1 cm x 6 cm vascu guard patch  . Carotid endarterectomy      Current Outpatient Rx  Name  Route  Sig  Dispense  Refill  . albuterol (PROVENTIL HFA;VENTOLIN HFA) 108 (90 Base) MCG/ACT inhaler   Inhalation   Inhale 2 puffs into the lungs every 6 (six) hours as needed for wheezing or shortness of breath.   1 Inhaler   2   . aspirin 325 MG tablet   Oral   Take 325 mg by mouth daily.         . benazepril-hydrochlorthiazide (LOTENSIN HCT) 20-12.5 MG tablet   Oral   Take 1 tablet by mouth daily.      4   . clonazePAM (KLONOPIN) 0.5 MG tablet   Oral   Take 0.5 tablets (0.25 mg total) by mouth at bedtime.   30 tablet   0   . gabapentin (NEURONTIN) 100 MG  capsule   Oral   Take 100 mg by mouth 3 (three) times daily.          . metoprolol tartrate (LOPRESSOR) 25 MG tablet   Oral   Take 25 mg by mouth 2 (two) times daily.         Marland Kitchen amLODipine (NORVASC) 2.5 MG tablet   Oral   Take 1 tablet (2.5 mg total) by mouth daily. Patient not taking: Reported on 06/23/2016   30 tablet   0   . escitalopram (LEXAPRO) 10 MG tablet   Oral   Take 10 mg by mouth daily.          . furosemide (LASIX) 20 MG tablet   Oral   Take 20 mg by mouth.         Vladimir Faster Glycol-Propyl Glycol (SYSTANE PRESERVATIVE FREE) 0.4-0.3 % SOLN   Both Eyes   Place 1 drop into both eyes 2 (two) times daily. For dry eyes         . simvastatin (ZOCOR) 10 MG tablet   Oral   Take 10 mg by mouth 3 (three) times a week.          . traMADol (ULTRAM) 50 MG tablet   Oral   Take 1 tablet (50 mg total) by mouth every 6 (six) hours as needed. For pain Patient not taking: Reported on 04/27/2016   30 tablet   0   . travoprost, benzalkonium, (TRAVATAN) 0.004 % ophthalmic solution   Both Eyes   Place 1 drop into both eyes at bedtime.            Allergies Amlodipine  Family History  Problem Relation Age of Onset  . Stroke Father   . Heart attack Father     Social History Social History  Substance Use Topics  . Smoking status: Never Smoker   . Smokeless tobacco: Never Used  . Alcohol Use: No    Review of Systems  Constitutional: No fever/chills Eyes: No visual changes. ENT: No sore throat. Cardiovascular: Denies chest pain. Respiratory: Denies shortness of breath. Gastrointestinal: No abdominal pain.  No nausea, no vomiting.  No diarrhea.  No constipation. Genitourinary: Negative for dysuria. Musculoskeletal: Negative for back pain. Skin: Negative for rash. Neurological: Negative for headaches, focal weakness or numbness.  10-point ROS otherwise negative.  ____________________________________________   PHYSICAL EXAM:  VITAL SIGNS: ED Triage Vitals  Enc Vitals Group     BP 06/23/16 1536 184/87 mmHg     Pulse Rate 06/23/16 1536 63     Resp 06/23/16 1536 22     Temp 06/23/16 1536 98 F (36.7 C)     Temp Source 06/23/16 1536 Oral     SpO2 06/23/16 1536 96 %    Constitutional: Alert and oriented. Well appearing and in no acute distress. Hard of hearing.  Eyes: Conjunctivae are normal. PERRL. EOMI. Head: Atraumatic. Nose: No congestion/rhinnorhea. Mouth/Throat: Mucous membranes are moist.  Oropharynx non-erythematous. Neck: No stridor.   Cardiovascular: Normal rate, regular rhythm. Good peripheral circulation. Grossly normal heart sounds.   Respiratory: Normal respiratory effort.  No retractions. Lungs CTAB. Gastrointestinal: Soft and nontender. No distention.  Musculoskeletal:  No lower extremity tenderness. Mild pitting edema. No gross deformities of extremities. Neurologic:  Normal speech and language. No gross focal neurologic deficits are appreciated.  Skin:  Skin is warm, dry and intact. No rash noted. Psychiatric: Mood and affect are normal. Speech and behavior are normal.  ____________________________________________   LABS (all labs ordered  are listed, but only abnormal results are displayed)  Labs Reviewed  URINALYSIS, ROUTINE W REFLEX MICROSCOPIC (NOT AT [facility]) - Abnormal; Notable for the following:    APPearance CLOUDY (*)    Leukocytes, UA MODERATE (*)    All other components within normal limits  URINE MICROSCOPIC-ADD ON - Abnormal; Notable for the following:    Squamous Epithelial / LPF 0-5 (*)    Bacteria, UA RARE (*)    All other components within normal limits  I-STAT CHEM 8, ED   ____________________________________________  EKG  Reviewed. No STEMI.  ____________________________________________  RADIOLOGY  No results found.  None ____________________________________________   PROCEDURES  Procedure(s) performed:   Procedures  None ____________________________________________   INITIAL IMPRESSION / ASSESSMENT AND PLAN / ED COURSE  Pertinent labs & imaging results that were available during my care of the patient were reviewed by me and considered in my medical decision making (see chart for details).  Patient presents to the emergency department for evaluation of asymptomatic hypertension. She has mild elevated blood pressure in the emergency department. No focal findings on my neurological exam. No features to suggest atypical ACS symptoms or stigmata of end-organ dysfunction 2/2 elevated BP. Plan for baseline labs and EKG. Will not alter BP medication at this time. Discussed this with daughter at bedside. They will see the PCP in the coming week. UA is equivocal. Patient with no UTI symptoms. Will follow culture and treat  as needed. Obtained UA to assess protein in the urine rather than infection. No clinical concern for pyelonephritis.    ____________________________________________  FINAL CLINICAL IMPRESSION(S) / ED DIAGNOSES  Final diagnoses:  Essential hypertension  Urinary frequency     MEDICATIONS GIVEN DURING THIS VISIT:  Medications  gabapentin (NEURONTIN) capsule 100 mg (100 mg Oral Given 06/23/16 1747)     NEW OUTPATIENT MEDICATIONS STARTED DURING THIS VISIT:  None   Note:  This document was prepared using Dragon voice recognition software and may include unintentional dictation errors.  Nanda Quinton, MD Emergency Medicine  Margette Fast, MD 06/24/16 [phone]

## 2016-06-23 NOTE — ED Notes (Signed)
Patient presents from home via EMS for hypertension. Patient c/o fatigue, anxiety.   Last VS: 150/78, 60hr, 100ra, 16resp, 113cbg  18g left AC

## 2017-02-08 ENCOUNTER — Ambulatory Visit: Payer: Medicare Other | Admitting: Family

## 2017-02-08 ENCOUNTER — Encounter (HOSPITAL_COMMUNITY): Payer: Medicare Other

## 2017-02-19 ENCOUNTER — Encounter: Payer: Self-pay | Admitting: Family

## 2017-03-01 ENCOUNTER — Encounter (HOSPITAL_COMMUNITY): Payer: Medicare Other

## 2017-03-01 ENCOUNTER — Ambulatory Visit: Payer: Medicare Other | Admitting: Family

## 2017-04-24 ENCOUNTER — Emergency Department (HOSPITAL_COMMUNITY)
Admission: EM | Admit: 2017-04-24 | Discharge: 2017-04-24 | Disposition: A | Discharge status: Home / Self Care | Payer: Medicare Other | Attending: Emergency Medicine | Admitting: Emergency Medicine

## 2017-04-24 ENCOUNTER — Emergency Department (HOSPITAL_COMMUNITY): Payer: Medicare Other

## 2017-04-24 ENCOUNTER — Encounter (HOSPITAL_COMMUNITY): Payer: Self-pay | Admitting: Emergency Medicine

## 2017-04-24 DIAGNOSIS — Z79899 Other long term (current) drug therapy: Secondary | ICD-10-CM | POA: Insufficient documentation

## 2017-04-24 DIAGNOSIS — Y999 Unspecified external cause status: Secondary | ICD-10-CM | POA: Diagnosis not present

## 2017-04-24 DIAGNOSIS — Y9301 Activity, walking, marching and hiking: Secondary | ICD-10-CM | POA: Insufficient documentation

## 2017-04-24 DIAGNOSIS — Z7982 Long term (current) use of aspirin: Secondary | ICD-10-CM | POA: Insufficient documentation

## 2017-04-24 DIAGNOSIS — Y92002 Bathroom of unspecified non-institutional (private) residence single-family (private) house as the place of occurrence of the external cause: Secondary | ICD-10-CM | POA: Insufficient documentation

## 2017-04-24 DIAGNOSIS — J441 Chronic obstructive pulmonary disease with (acute) exacerbation: Secondary | ICD-10-CM | POA: Insufficient documentation

## 2017-04-24 DIAGNOSIS — S82832A Other fracture of upper and lower end of left fibula, initial encounter for closed fracture: Secondary | ICD-10-CM | POA: Insufficient documentation

## 2017-04-24 DIAGNOSIS — W1839XA Other fall on same level, initial encounter: Secondary | ICD-10-CM | POA: Diagnosis not present

## 2017-04-24 DIAGNOSIS — S8992XA Unspecified injury of left lower leg, initial encounter: Secondary | ICD-10-CM | POA: Diagnosis present

## 2017-04-24 DIAGNOSIS — I10 Essential (primary) hypertension: Secondary | ICD-10-CM | POA: Insufficient documentation

## 2017-04-24 LAB — URINALYSIS, ROUTINE W REFLEX MICROSCOPIC
BILIRUBIN URINE: NEGATIVE
Bacteria, UA: NONE SEEN
GLUCOSE, UA: NEGATIVE mg/dL
HGB URINE DIPSTICK: NEGATIVE
KETONES UR: NEGATIVE mg/dL
NITRITE: NEGATIVE
PH: 6 (ref 5.0–8.0)
Protein, ur: NEGATIVE mg/dL
Specific Gravity, Urine: 1.008 (ref 1.005–1.030)

## 2017-04-24 LAB — CBC
HEMATOCRIT: 44.9 % (ref 36.0–46.0)
Hemoglobin: 15 g/dL (ref 12.0–15.0)
MCH: 29.3 pg (ref 26.0–34.0)
MCHC: 33.4 g/dL (ref 30.0–36.0)
MCV: 87.7 fL (ref 78.0–100.0)
Platelets: 218 10*3/uL (ref 150–400)
RBC: 5.12 MIL/uL — ABNORMAL HIGH (ref 3.87–5.11)
RDW: 12.6 % (ref 11.5–15.5)
WBC: 8.6 10*3/uL (ref 4.0–10.5)

## 2017-04-24 LAB — BASIC METABOLIC PANEL
ANION GAP: 5 (ref 5–15)
BUN: 14 mg/dL (ref 6–20)
CO2: 30 mmol/L (ref 22–32)
Calcium: 9.2 mg/dL (ref 8.9–10.3)
Chloride: 104 mmol/L (ref 101–111)
Creatinine, Ser: 0.87 mg/dL (ref 0.44–1.00)
GFR calc Af Amer: >60 mL/min (ref >60)
GFR calc non Af Amer: 57 mL/min — ABNORMAL LOW (ref >60)
GLUCOSE: 111 mg/dL — AB (ref 65–99)
POTASSIUM: 3.4 mmol/L — AB (ref 3.5–5.1)
Sodium: 139 mmol/L (ref 135–145)

## 2017-04-24 MED ORDER — FUROSEMIDE 40 MG PO TABS: 20.0000 mg | ORAL_TABLET | Freq: Every day | ORAL | Status: DC | PRN

## 2017-04-24 MED ORDER — ASPIRIN 325 MG PO TABS
325.0000 mg | ORAL_TABLET | Freq: Every day | ORAL | Status: DC
  Administered 2017-04-24: 325 mg via ORAL
  Filled 2017-04-24: qty 1

## 2017-04-24 MED ORDER — ONDANSETRON HCL 4 MG/2ML IJ SOLN
4.0000 mg | Freq: Once | INTRAMUSCULAR | Status: AC
  Administered 2017-04-24: 4 mg via INTRAVENOUS
  Filled 2017-04-24: qty 2

## 2017-04-24 MED ORDER — BENAZEPRIL-HYDROCHLOROTHIAZIDE 20-12.5 MG PO TABS: 1.0000 | ORAL_TABLET | ORAL | Status: DC

## 2017-04-24 MED ORDER — GABAPENTIN 100 MG PO CAPS
100.0000 mg | ORAL_CAPSULE | Freq: Three times a day (TID) | ORAL | Status: DC
  Administered 2017-04-24: 100 mg via ORAL
  Filled 2017-04-24: qty 1

## 2017-04-24 MED ORDER — CLONAZEPAM 0.5 MG PO TABS: 0.2500 mg | ORAL_TABLET | Freq: Every day | ORAL | Status: DC

## 2017-04-24 MED ORDER — GABAPENTIN 100 MG PO CAPS: 100.0000 mg | ORAL_CAPSULE | Freq: Three times a day (TID) | ORAL | Status: DC

## 2017-04-24 MED ORDER — METOPROLOL TARTRATE 25 MG PO TABS
25.0000 mg | ORAL_TABLET | Freq: Two times a day (BID) | ORAL | Status: DC
  Administered 2017-04-24: 25 mg via ORAL
  Filled 2017-04-24: qty 1

## 2017-04-24 MED ORDER — METOPROLOL TARTRATE 25 MG PO TABS
25.0000 mg | ORAL_TABLET | Freq: Once | ORAL | Status: AC
  Administered 2017-04-24: 25 mg via ORAL
  Filled 2017-04-24: qty 1

## 2017-04-24 MED ORDER — ALUM & MAG HYDROXIDE-SIMETH 200-200-20 MG/5ML PO SUSP
15.0000 mL | Freq: Once | ORAL | Status: AC
  Administered 2017-04-24: 15 mL via ORAL
  Filled 2017-04-24: qty 30

## 2017-04-24 MED ORDER — TRAVOPROST 0.004 % OP SOLN: 1.0000 [drp] | Freq: Every day | OPHTHALMIC | Status: DC

## 2017-04-24 MED ORDER — MORPHINE SULFATE (PF) 4 MG/ML IV SOLN
2.0000 mg | Freq: Once | INTRAVENOUS | Status: AC
  Administered 2017-04-24: 2 mg via INTRAVENOUS
  Filled 2017-04-24: qty 1

## 2017-04-24 MED ORDER — ALBUTEROL SULFATE HFA 108 (90 BASE) MCG/ACT IN AERS: 2.0000 | INHALATION_SPRAY | Freq: Four times a day (QID) | RESPIRATORY_TRACT | Status: DC | PRN

## 2017-04-24 MED ORDER — SIMVASTATIN 10 MG PO TABS: 10.0000 mg | ORAL_TABLET | ORAL | Status: DC

## 2017-04-24 MED ORDER — POLYETHYL GLYCOL-PROPYL GLYCOL 0.4-0.3 % OP SOLN: 1.0000 [drp] | Freq: Two times a day (BID) | OPHTHALMIC | Status: DC

## 2017-04-24 MED ORDER — ESCITALOPRAM OXALATE 10 MG PO TABS: 10.0000 mg | ORAL_TABLET | ORAL | Status: DC

## 2017-04-24 NOTE — NC FL2 (Signed)
[place] LEVEL OF CARE SCREENING TOOL     IDENTIFICATION  Patient Name: Michaela Flynn Birthdate: Apr 10, 1925 Sex: female Admission Date (Current Location): 04/24/2017  [facility] and [place] Number:  Herbalist and Address:         Provider Number: [id number]  Attending Physician Name and Address:  Isla Pence, MD  Relative Name and Phone Number:       Current Level of Care: Hospital Recommended Level of Care: [place] Prior Approval Number:    Date Approved/Denied:   PASRR Number:  [id number]   Discharge Plan: SNF    Current Diagnoses: Patient Active Problem List   Diagnosis Date Noted  . COPD exacerbation ([place]) 03/26/2016  . Sepsis ([place]) 03/25/2016  . Leg pain   . CAP (community acquired pneumonia) 03/23/2016  . Community acquired pneumonia   . UTI (lower urinary tract infection)   . Aftercare following surgery of the circulatory system, [place] 07/10/2013  . Occlusion and stenosis of carotid artery without mention of cerebral infarction 12/05/2012    Orientation RESPIRATION BLADDER Height & Weight     Self, Situation, Place  Normal Incontinent Weight: 185 lb (83.9 kg) Height:  5\' 3"  (160 cm)  BEHAVIORAL SYMPTOMS/MOOD NEUROLOGICAL BOWEL NUTRITION STATUS      Continent Diet (regular)  AMBULATORY STATUS COMMUNICATION OF NEEDS Skin   Extensive Assist Verbally Normal                       Personal Care Assistance Level of Assistance  Bathing, Feeding, Dressing Bathing Assistance: Limited assistance Feeding assistance: Independent Dressing Assistance: Limited assistance     Functional Limitations Info  Sight, Hearing Sight Info: Impaired Hearing Info: Impaired      SPECIAL CARE FACTORS FREQUENCY  PT (By licensed PT), OT (By licensed OT)     PT Frequency: 5x/week OT Frequency: 5x/week            Contractures Contractures Info: Not present    Additional Factors Info  Code Status, Allergies  Code:  DNR Allergies Info: Allergies:  Amlodipine           Current Medications (04/24/2017):  This is the current hospital active medication list No current facility-administered medications for this encounter.    Current Outpatient Prescriptions  Medication Sig Dispense Refill  . albuterol (PROVENTIL HFA;VENTOLIN HFA) 108 (90 Base) MCG/ACT inhaler Inhale 2 puffs into the lungs every 6 (six) hours as needed for wheezing or shortness of breath. 1 Inhaler 2  . aspirin 325 MG tablet Take 325 mg by mouth daily at 12 noon.     . benazepril-hydrochlorthiazide (LOTENSIN HCT) 20-12.5 MG tablet Take 1 tablet by mouth every morning.   4  . clonazePAM (KLONOPIN) 0.5 MG tablet Take 0.5 tablets (0.25 mg total) by mouth at bedtime. 30 tablet 0  . escitalopram (LEXAPRO) 10 MG tablet Take 10 mg by mouth every morning.     . furosemide (LASIX) 20 MG tablet Take 20 mg by mouth daily as needed for fluid or edema.     . gabapentin (NEURONTIN) 100 MG capsule Take 100 mg by mouth 3 (three) times daily.     . metoprolol tartrate (LOPRESSOR) 25 MG tablet Take 25 mg by mouth 2 (two) times daily.    . Misc Natural Products (TART CHERRY ADVANCED PO) Take 2 Cartridges by mouth daily at 12 noon.    Vladimir Faster Glycol-Propyl Glycol (SYSTANE PRESERVATIVE FREE) 0.4-0.3 % SOLN Place 1 drop into  both eyes 2 (two) times daily. For dry eyes    . simvastatin (ZOCOR) 10 MG tablet Take 10 mg by mouth 3 (three) times a week.    . travoprost, benzalkonium, (TRAVATAN) 0.004 % ophthalmic solution Place 1 drop into both eyes at bedtime.     Marland Kitchen amLODipine (NORVASC) 2.5 MG tablet Take 1 tablet (2.5 mg total) by mouth daily. (Patient not taking: Reported on 06/23/2016) 30 tablet 0  . traMADol (ULTRAM) 50 MG tablet Take 1 tablet (50 mg total) by mouth every 6 (six) hours as needed. For pain (Patient not taking: Reported on 04/27/2016) 30 tablet 0     Discharge Medications: Please see current outpatient prescriptions list for a list of  discharge medications.  Relevant Imaging Results:  Relevant Lab Results:   Additional Information SSN [id number]  Burnis Medin, LCSW

## 2017-04-24 NOTE — ED Notes (Signed)
Bed: WA21 Expected date:  Expected time:  Means of arrival:  Comments: 83 yo F/Fall

## 2017-04-24 NOTE — ED Notes (Signed)
I have called PT, waiting for a call back

## 2017-04-24 NOTE — ED Triage Notes (Addendum)
Patient here via EMS with complaints of fall today while trying to go to the restroom. No LOC. Reports bilaterally chronic leg pain. CBG 79

## 2017-04-24 NOTE — ED Notes (Signed)
Dr.Pollina notified of pt latest blood pressure. No further orders given at this time.

## 2017-04-24 NOTE — ED Provider Notes (Signed)
Pt accepted at [facility].  Pt to be transported there.   Isla Pence, MD 04/24/17 1427

## 2017-04-24 NOTE — Evaluation (Addendum)
Physical Therapy Evaluation Patient Details Name: Michaela Flynn MRN: [id number] DOB: 05-03-1925 Today's Date: 04/24/2017   History of Present Illness  83 yo female  admitted due to fall at home, sustained nondisplaced diistal fibular fracture. Ortho sonsult pending( No indication of weight bearing status-, order for CAM boot).  Clinical Impression  The patient is very HOH, daughter and son present to provide info and report that the patient  Is not her " normal  Self", more subdued.   The patient complains of right abdominal pain and nausea and states" when I don't eat, I get nauseated. Can I have something to eat?" Per xray, nondispaced fibular fracture on the left. No orders for WBS, order for CAM boot. There is some blood on the plantar 1st met head area, daughter states there is a cut there.  Pt admitted with above diagnosis. Pt currently with functional limitations due to the deficits listed below (see PT Problem List).  Pt will benefit from skilled PT to increase their independence and safety with mobility to allow discharge to the venue listed below.   The patient's BP supine  128/55, sitting 87/56.      Follow Up Recommendations SNF;Supervision/Assistance - 24 hour    Equipment Recommendations  None recommended by PT    Recommendations for Other Services       Precautions / Restrictions Precautions Precautions: Fall Required Braces or Orthoses: Other Brace/Splint Other Brace/Splint: CAM on left Restrictions Other Position/Activity Restrictions: no orders for WBS      Mobility  Bed Mobility Overal bed mobility: Needs Assistance Bed Mobility: Rolling;Supine to Sit;Sit to Supine Rolling: Mod assist;+2 for safety/equipment   Supine to sit: Max assist;+2 for physical assistance;HOB elevated;+2 for safety/equipment Sit to supine: Max assist;+2 for physical assistance;+2 for safety/equipment   General bed mobility comments: assist with rolling to each side for depends to be  placed.  Assist with trunk and legs to sitting at bed edge. patient self assisted to  to scoot  to edge. Sat with min guard.   Transfers                 General transfer comment: Did not attempt standing as no WBS orders as well as the gurney is too high to safely attempt.  Ambulation/Gait                Stairs            Wheelchair Mobility    Modified Rankin (Stroke Patients Only)       Balance Overall balance assessment: History of Falls;Needs assistance Sitting-balance support: Bilateral upper extremity supported Sitting balance-Leahy Scale: Fair Sitting balance - Comments: sat x 5 minutes                                     Pertinent Vitals/Pain Pain Assessment: Faces Faces Pain Scale: Hurts even more Pain Location: right side abdomen, feels firm at the site Pain Descriptors / Indicators: Cramping Pain Intervention(s): Monitored during session    Home Living Family/patient expects to be discharged to:: Private residence Living Arrangements: Alone Available Help at Discharge: Personal care attendant Type of Home: Apartment Home Access: Level entry     Home Layout: One level Home Equipment: Walker - 4 wheels      Prior Function Level of Independence: Independent with assistive device(s)         Comments: daughter provided info- caregivers 5 hours /  day during week, limited on weekends.     Hand Dominance        Extremity/Trunk Assessment   Upper Extremity Assessment Upper Extremity Assessment: Generalized weakness    Lower Extremity Assessment Lower Extremity Assessment: LLE deficits/detail LLE Deficits / Details: CAM boot placed.    Cervical / Trunk Assessment Cervical / Trunk Assessment: Kyphotic  Communication   Communication: HOH  Cognition Arousal/Alertness: Awake/alert   Overall Cognitive Status: Impaired/Different from baseline Area of Impairment: Orientation;Following commands                        Following Commands: Follows one step commands consistently       General Comments: family report that the patient is more subdued, not her normal . the patient doe follow simple commands. HOH is limiting      General Comments      Exercises     Assessment/Plan    PT Assessment Patient needs continued PT services  PT Problem List Decreased strength;Decreased activity tolerance;Decreased balance;Decreased mobility;Decreased knowledge of precautions;Decreased safety awareness;Decreased knowledge of use of DME;Pain;Cardiopulmonary status limiting activity       PT Treatment Interventions DME instruction;Gait training;Functional mobility training;Therapeutic activities;Patient/family education    PT Goals (Current goals can be found in the Care Plan section)  Acute Rehab PT Goals Patient Stated Goal: per daughter , to go to rehqab PT Goal Formulation: With patient/family Time For Goal Achievement: 05/08/17 Potential to Achieve Goals: Fair    Frequency Min 3X/week   Barriers to discharge        Co-evaluation               AM-PAC PT "6 Clicks" Daily Activity  Outcome Measure Difficulty turning over in bed (including adjusting bedclothes, sheets and blankets)?: A Lot Difficulty moving from lying on back to sitting on the side of the bed? : A Lot Difficulty sitting down on and standing up from a chair with arms (e.g., wheelchair, bedside commode, etc,.)?: Total Help needed moving to and from a bed to chair (including a wheelchair)?: Total Help needed walking in hospital room?: Total Help needed climbing 3-5 steps with a railing? : Total 6 Click Score: 8    End of Session   Activity Tolerance: Patient tolerated treatment well Patient left: with call bell/phone within reach;with family/visitor present Nurse Communication: Mobility status PT Visit Diagnosis: Difficulty in walking, not elsewhere classified (R26.2);Pain Pain - Right/Left:  (abdomen)    Time:  1017-1050 PT Time Calculation (min) (ACUTE ONLY): 33 min   Charges:   PT Evaluation $PT Eval Low Complexity: 1 Procedure     PT G Codes:   PT G-Codes **NOT FOR INPATIENT CLASS** Functional Assessment Tool Used: AM-PAC 6 Clicks Basic Mobility;Clinical judgement Functional Limitation: Mobility: Walking and moving around Mobility: Walking and Moving Around Current Status (F0263): At least 80 percent but less than 100 percent impaired, limited or restricted Mobility: Walking and Moving Around Goal Status [phone]): At least 40 percent but less than 60 percent impaired, limited or restricted    [facility] PT [phone]   Claretha Cooper 04/24/2017, 11:29 AM

## 2017-04-24 NOTE — ED Notes (Signed)
PT[facility] call for pt transportion to [facility].

## 2017-04-24 NOTE — ED Provider Notes (Signed)
Pt signed out by Dr. Betsey Holiday awaiting disposition.  Pt did fall and sustained a distal fibula fx.  Pt lives alone and is not able to ambulate with a cam walker.  WBAT.  Pt's family does not feel able to take care of pt at their home.  They are interested in SNF.  Physical therapy eval done and SW consulted.  Pt awaiting placement in SNF.  Home meds and diet ordered.   Isla Pence, MD 04/24/17 1246

## 2017-04-24 NOTE — Clinical Social Work Note (Signed)
Clinical Social Work Assessment  Patient Details  Name: Michaela Flynn MRN: [id number] Date of Birth: 1925-08-06  Date of referral:  04/24/17               Reason for consult:  Facility Placement                Permission sought to share information with:  Facility Art therapist granted to share information::  Yes, Verbal Permission Granted  Name::        Agency::     Relationship::     Contact Information:     Housing/Transportation Living arrangements for the past 2 months:  Apartment Source of Information:  Adult Children, Patient Patient Interpreter Needed:  None Criminal Activity/Legal Involvement Pertinent to Current Situation/Hospitalization:  No - Comment as needed Significant Relationships:  Adult Children Lives with:    Do you feel safe going back to the place where you live?  Yes Need for family participation in patient care:  Yes (Comment)  Care giving concerns:  Patient recently had a fall and is now having issues with ambulating. Patient resides alone and does not have 24 hour care. PT recommends SNF for ST rehab.   Social Worker assessment / plan:  CSW spoke with patient/patient's son Michaela Flynn [phone]) at bedside. Patient alert, oriented x3 and has difficulty with hearing. Patient's son reports that patient resides alone and has a caregiver that comes 6x/week for a few hours to assist with ADLs. Patient's son reports that he and his sister along with patient's grandchildren also assist patient in the home with ADLs. Patient's son reports that he has concerns with patient returning home after fall because she does not have 24 hour care. Patient's son reports that patient has been to [facility] in the past and it was a good experience. Patient's son reported that his sister, patient's daughter Michaela Flynn [phone]) is patient's HCPOA. CSW contacted patient's daughter and confirmed plan for patient moving forward. Patient's daughter  reported that she has concerns about patient returning home and is agreeable to SNF for ST rehab at [facility], patient's daughter requested PTAR for transport. CSW will complete FL2 and follow up with [facility].   Employment status:  Retired Nurse, adult PT Recommendations:  [place] / Referral to community resources:  [place]  Patient/Family's Response to care:  Patient,patient's son and patient's daughter agreeable to [place] rehab at [facility].  Patient/Family's Understanding of and Emotional Response to Diagnosis, Current Treatment, and Prognosis:  Patient presented pleasant continuously smiling during assessment. Patient's son verbalized strong understanding of plan and patient's need for ST rehab at [facility]. Patient verbalized understanding for ST rehab at SNF, noting that after rehab she will return home.   Emotional Assessment Appearance:  Appears stated age Attitude/Demeanor/Rapport:   (Cooperative) Affect (typically observed):  Pleasant Orientation:  Oriented to Self, Oriented to Place, Oriented to Situation Alcohol / Substance use:  Not Applicable Psych involvement (Current and /or in the community):  No (Comment)  Discharge Needs  Concerns to be addressed:  Care Coordination Readmission within the last 30 days:  No Current discharge risk:  None Barriers to Discharge:  No Barriers Identified   Burnis Medin, LCSW 04/24/2017, 11:51 AM

## 2017-04-24 NOTE — ED Notes (Signed)
Report given to [facility] RN.

## 2017-04-24 NOTE — ED Provider Notes (Signed)
[facility] DEPT Provider Note   CSN: [id number] Arrival date & time: 04/24/17  0308   By signing my name below, I, Eunice Blase, attest that this documentation has been prepared under the direction and in the presence of Orpah Greek, MD. Electronically signed, Eunice Blase, ED Scribe. 04/24/17. 3:40 AM.   History   Chief Complaint Chief Complaint  Patient presents with  . Fall   LEVEL 5 CAVEAT: HPI and ROS limited due to pt very hard of hearing   The history is provided by the patient and medical records. The history is limited by the condition of the patient. No language interpreter was used.    Michaela Flynn is a 83 y.o. female BIB EMS who presents to the Emergency Department with concern for pain in multiple locations s/p a fall that occurred PTA. Pt allegedly fell while walking to the restroom and states he L leg gave out on her.  L foot and R hip pain noted. Triage notes chronic bilateral leg pain. No other complaints at this time.    Past Medical History:  Diagnosis Date  . Abnormal LFTs    secondary to cholesterol  medication  . Anxiety and depression   . Arthritis    Lower back," Osteo"  . Cancer [facility]) 1972   Nonmelanoma resection  . Carotid artery occlusion    Bruit  . Diverticulitis   . Fibrocystic breast disease   . Glaucoma   . Hyperlipidemia   . Hypertension   . PONV (postoperative nausea and vomiting)   . S/P tonsillectomy and adenoidectomy 1975  . Shingles 2010    Patient Active Problem List   Diagnosis Date Noted  . COPD exacerbation ([place]) 03/26/2016  . Sepsis ([place]) 03/25/2016  . Leg pain   . CAP (community acquired pneumonia) 03/23/2016  . Community acquired pneumonia   . UTI (lower urinary tract infection)   . Aftercare following surgery of the circulatory system, [place] 07/10/2013  . Occlusion and stenosis of carotid artery without mention of cerebral infarction 12/05/2012    Past Surgical History:  Procedure Laterality Date  .  ABDOMINAL HYSTERECTOMY  1980's  . APPENDECTOMY  1956  . CAROTID ENDARTERECTOMY    . CHOLECYSTECTOMY  1956  . COLONOSCOPY  2008   [facility]  . ENDARTERECTOMY  12/23/2012   Procedure: ENDARTERECTOMY CAROTID;  Surgeon: Serafina Mitchell, MD;  Location: [facility] OR;  Service: Vascular;  Laterality: Right;  . ESOPHAGOGASTRODUODENOSCOPY     ?  Zenker's per the patient  . EYE SURGERY     Cataract  . PATCH ANGIOPLASTY  12/23/2012   Procedure: PATCH ANGIOPLASTY;  Surgeon: Serafina Mitchell, MD;  Location: [facility] OR;  Service: Vascular;  Laterality: Right;  Using 1 cm x 6 cm vascu guard patch  . TOE SURGERY     Onychomycosis  . TONSILLECTOMY AND ADENOIDECTOMY      OB History    No data available       Home Medications    Prior to Admission medications   Medication Sig Start Date End Date Taking? Authorizing Provider  albuterol (PROVENTIL HFA;VENTOLIN HFA) 108 (90 Base) MCG/ACT inhaler Inhale 2 puffs into the lungs every 6 (six) hours as needed for wheezing or shortness of breath. 03/27/16  Yes Tat, Shanon Brow, MD  aspirin 325 MG tablet Take 325 mg by mouth daily at 12 noon.    Yes [provider]  benazepril-hydrochlorthiazide (LOTENSIN HCT) 20-12.5 MG tablet Take 1 tablet by mouth every morning.  01/27/16  Yes [provider]  clonazePAM (KLONOPIN) 0.5 MG tablet Take 0.5 tablets (0.25 mg total) by mouth at bedtime. 03/27/16  Yes Tat, Shanon Brow, MD  escitalopram (LEXAPRO) 10 MG tablet Take 10 mg by mouth every morning.    Yes [provider]  furosemide (LASIX) 20 MG tablet Take 20 mg by mouth daily as needed for fluid or edema.    Yes [provider]  gabapentin (NEURONTIN) 100 MG capsule Take 100 mg by mouth 3 (three) times daily.  02/03/16  Yes [provider]  metoprolol tartrate (LOPRESSOR) 25 MG tablet Take 25 mg by mouth 2 (two) times daily.   Yes [provider]  Misc Natural Products (TART CHERRY ADVANCED PO) Take 2 Cartridges by mouth daily at  12 noon.   Yes [provider]  Polyethyl Glycol-Propyl Glycol (SYSTANE PRESERVATIVE FREE) 0.4-0.3 % SOLN Place 1 drop into both eyes 2 (two) times daily. For dry eyes   Yes [provider]  simvastatin (ZOCOR) 10 MG tablet Take 10 mg by mouth 3 (three) times a week.   Yes [provider]  travoprost, benzalkonium, (TRAVATAN) 0.004 % ophthalmic solution Place 1 drop into both eyes at bedtime.    Yes [provider]  amLODipine (NORVASC) 2.5 MG tablet Take 1 tablet (2.5 mg total) by mouth daily. Patient not taking: Reported on 06/23/2016 03/28/16   Orson Eva, MD  traMADol (ULTRAM) 50 MG tablet Take 1 tablet (50 mg total) by mouth every 6 (six) hours as needed. For pain Patient not taking: Reported on 04/27/2016 03/27/16   Orson Eva, MD    Family History Family History  Problem Relation Age of Onset  . Stroke Father   . Heart attack Father     Social History Social History  Substance Use Topics  . Smoking status: Never Smoker  . Smokeless tobacco: Never Used  . Alcohol use No     Allergies   Amlodipine   Review of Systems Review of Systems  Unable to perform ROS: Acuity of condition     Physical Exam Updated Vital Signs BP (!) 200/84 (BP Location: Right Arm)   Pulse 65   Temp 98.6 F (37 C) (Oral)   Resp 18   SpO2 95%   Physical Exam  Constitutional: She is oriented to person, place, and time. She appears well-developed and well-nourished. No distress.  HENT:  Head: Normocephalic and atraumatic.  Right Ear: Hearing normal.  Left Ear: Hearing normal.  Nose: Nose normal.  Mouth/Throat: Oropharynx is clear and moist and mucous membranes are normal.  Eyes: Conjunctivae and EOM are normal. Pupils are equal, round, and reactive to light.  Neck: Normal range of motion. Neck supple.  Cardiovascular: Regular rhythm, S1 normal and S2 normal.  Exam reveals no gallop and no friction rub.   No murmur heard. Pulmonary/Chest: Effort normal and  breath sounds normal. No respiratory distress. She exhibits no tenderness.  Abdominal: Soft. Normal appearance and bowel sounds are normal. There is no hepatosplenomegaly. There is no tenderness. There is no rebound, no guarding, no tenderness at McBurney's point and negative Murphy's sign. No hernia.  Musculoskeletal: Normal range of motion. She exhibits tenderness. She exhibits no deformity.  Pain with ROM of R hip without deformity  Neurological: She is alert and oriented to person, place, and time. She has normal strength. No cranial nerve deficit or sensory deficit. Coordination normal. GCS eye subscore is 4. GCS verbal subscore is 5. GCS motor subscore is 6.  Skin: Skin is warm, dry and intact. No rash noted. No cyanosis.  Partial avulsion of the nail of L second toe  Psychiatric: She has a normal mood and affect. Her speech is normal and behavior is normal. Thought content normal.  Nursing note and vitals reviewed.    ED Treatments / Results  DIAGNOSTIC STUDIES: Oxygen Saturation is 95% on RA, adequate by my interpretation.     Labs (all labs ordered are listed, but only abnormal results are displayed) Labs Reviewed  URINALYSIS, ROUTINE W REFLEX MICROSCOPIC - Abnormal; Notable for the following:       Result Value   Leukocytes, UA SMALL (*)    Squamous Epithelial / LPF 0-5 (*)    All other components within normal limits  CBC  BASIC METABOLIC PANEL    EKG  EKG Interpretation None       Radiology Dg Chest 2 View  Result Date: 04/24/2017 CLINICAL DATA:  Knee gave out, left ankle and foot pain, initial encounter. EXAM: CHEST  2 VIEW COMPARISON:  03/25/2016. FINDINGS: Trachea is midline. Heart size within normal limits. Calcified hilar lymph nodes. Mild bibasilar atelectasis or scarring. No pleural fluid. No pneumothorax. Osseous structures appear grossly intact. IMPRESSION: Mild bibasilar atelectasis or scarring. Electronically Signed   By: Lorin Picket M.D.   On:  04/24/2017 07:49   Dg Ankle Complete Left  Result Date: 04/24/2017 CLINICAL DATA:  Knee gave out this morning with left ankle pain and swelling, initial encounter. EXAM: LEFT ANKLE COMPLETE - 3+ VIEW COMPARISON:  None. FINDINGS: There is a nondisplaced obliquely oriented fracture of the distal fibular shaft with overlying soft tissue swelling. Associated joint effusion. Calcaneal spurs. IMPRESSION: Nondisplaced distal fibular shaft fracture with overlying soft tissue swelling and a joint effusion. Electronically Signed   By: Lorin Picket M.D.   On: 04/24/2017 07:47   Dg Knee Complete 4 Views Left  Result Date: 04/24/2017 CLINICAL DATA:  Knee gave out this morning, initial encounter. EXAM: LEFT KNEE - COMPLETE 4+ VIEW COMPARISON:  None. FINDINGS: No acute osseous or joint abnormality.  No degenerative changes. IMPRESSION: Negative. Electronically Signed   By: Lorin Picket M.D.   On: 04/24/2017 07:43   Dg Knee Complete 4 Views Right  Result Date: 04/24/2017 CLINICAL DATA:  Knee gave out this morning, initial encounter. EXAM: RIGHT KNEE - COMPLETE 4+ VIEW COMPARISON:  None. FINDINGS: No acute osseous or joint abnormality.  No osteoarthritic changes. IMPRESSION: Negative. Electronically Signed   By: Lorin Picket M.D.   On: 04/24/2017 07:45   Dg Foot Complete Left  Result Date: 04/24/2017 CLINICAL DATA:  Left foot pain after a fall. EXAM: LEFT FOOT - COMPLETE 3+ VIEW COMPARISON:  None. FINDINGS: Diffuse bone demineralization. Degenerative changes in the interphalangeal joints and first metatarsal-phalangeal joint. No evidence of acute fracture or dislocation. No focal bone destruction or bone lesion. Small plantar calcaneal spur. Soft tissue swelling over the dorsum of the left foot. IMPRESSION: Diffuse bone demineralization. Degenerative changes. Soft tissue swelling. No acute fractures identified. Electronically Signed   By: Lucienne Capers M.D.   On: 04/24/2017 04:43   Dg Hip Unilat W Or Wo  Pelvis 2-3 Views Right  Result Date: 04/24/2017 CLINICAL DATA:  83 y/o  F; status post fall with right hip pain. EXAM: DG HIP (WITH OR WITHOUT PELVIS) 2-3V RIGHT COMPARISON:  None. FINDINGS: Moderate lower lumbar degenerative changes. Mild osteoarthrosis of bilateral sacroiliac joints. Moderate osteoarthrosis of the hip joints with acetabular fibrocystic degeneration. No acute  fracture or dislocation is identified. IMPRESSION: No acute fracture or dislocation identified. If symptoms persist or if clinically indicated MRI is more sensitive for acute fracture in the elderly. Electronically Signed   By: Kristine Garbe M.D.   On: 04/24/2017 04:40    Procedures Procedures (including critical care time)  Medications Ordered in ED Medications  morphine 4 MG/ML injection 2 mg (not administered)  ondansetron (ZOFRAN) injection 4 mg (not administered)  metoprolol tartrate (LOPRESSOR) tablet 25 mg (25 mg Oral Given 04/24/17 5284)     Initial Impression / Assessment and Plan / ED Course  I have reviewed the triage vital signs and the nursing notes.  Pertinent labs & imaging results that were available during my care of the patient were reviewed by me and considered in my medical decision making (see chart for details).     Patient presents to the ER for evaluation after a fall. Patient has recently been having increased pain in both her knees, presumably from arthritis. She reports that her leg gave out while walking to the bathroom, causing her to fall. She is having pain in the left foot and right hip. Examination of the left foot reveals partial avulsion of the nail of the second toe on the left foot. There is tenderness of the ankle as well. X-ray foot was negative. X-ray of ankle reveals distal fibula fracture. X-ray of right hip was negative. She did not hit her head or have any evidence of head trauma. Cervical spine, thoracic spine, lumbar spine nontender, no pain.  Patient unable to  bear any weight secondary to the ankle fracture. Discussed with Dr. Erlinda Hong, on call for orthopedics. He did not feel that she required complete nonweightbearing status, recommended Cam Walker.  Patient's labs are pending. Will sign out to oncoming ER provider to follow-up on labs and determine if patient is able to ambulate or will require admission.  Final Clinical Impressions(s) / ED Diagnoses   Final diagnoses:  Closed fracture of distal end of left fibula, unspecified fracture morphology, initial encounter  Essential hypertension    New Prescriptions New Prescriptions   No medications on file  I personally performed the services described in this documentation, which was scribed in my presence. The recorded information has been reviewed and is accurate.    Orpah Greek, MD 04/25/17 [phone]

## 2017-04-27 ENCOUNTER — Encounter: Payer: Self-pay | Admitting: Family

## 2017-04-30 ENCOUNTER — Ambulatory Visit (INDEPENDENT_AMBULATORY_CARE_PROVIDER_SITE_OTHER): Payer: Medicare Other | Admitting: Orthopaedic Surgery

## 2017-04-30 ENCOUNTER — Encounter (INDEPENDENT_AMBULATORY_CARE_PROVIDER_SITE_OTHER): Payer: Self-pay | Admitting: Orthopaedic Surgery

## 2017-04-30 DIAGNOSIS — S8265XA Nondisplaced fracture of lateral malleolus of left fibula, initial encounter for closed fracture: Secondary | ICD-10-CM | POA: Diagnosis not present

## 2017-04-30 NOTE — Progress Notes (Signed)
Office Visit Note   Patient: Michaela Flynn           Date of Birth: 1925-08-31           MRN: [id number] Visit Date: 04/30/2017              Requested by: Jani Gravel, MD [address], [place] [address] PCP: Jani Gravel, MD   Assessment & Plan: Visit Diagnoses:  1. Nondisplaced fracture of lateral malleolus of left fibula, initial encounter for closed fracture     Plan: Patient is low demand and would have a lot of difficulty being nonweightbearing. I think it's reasonable to allow her to weight-bear and a Cam Walker with a walker to see if she is able to withstand the pain. I will like to see her back in about 2 weeks with repeat 3 view x-rays of the left ankle.  Follow-Up Instructions: Return in about 2 weeks (around 05/14/2017).   Orders:  No orders of the defined types were placed in this encounter.  No orders of the defined types were placed in this encounter.     Procedures: No procedures performed   Clinical Data: No additional findings.   Subjective: Chief Complaint  Patient presents with  . Left Ankle - Fracture, Pain    Patient is a 83 year old low demand female who fell this past Saturday and sustained a nondisplaced fibula fracture. She does endorse some pain and swelling. She's been in a fracture boot and has been nonweightbearing. Pain does not radiate.    Review of Systems  Constitutional: Negative.   HENT: Negative.   Eyes: Negative.   Respiratory: Negative.   Cardiovascular: Negative.   Endocrine: Negative.   Musculoskeletal: Negative.   Neurological: Negative.   Hematological: Negative.   Psychiatric/Behavioral: Negative.   All other systems reviewed and are negative.    Objective: Vital Signs: There were no vitals taken for this visit.  Physical Exam  Constitutional: She is oriented to person, place, and time. She appears well-developed and well-nourished.  HENT:  Head: Normocephalic and atraumatic.  Eyes: EOM are  normal.  Neck: Neck supple.  Pulmonary/Chest: Effort normal.  Abdominal: Soft.  Neurological: She is alert and oriented to person, place, and time.  Skin: Skin is warm. Capillary refill takes less than 2 seconds.  Psychiatric: She has a normal mood and affect. Her behavior is normal. Judgment and thought content normal.  Nursing note and vitals reviewed.   Ortho Exam Left ankle exam shows brawny color skin changes. She has no open wounds. She has mild tenderness to palpation over the fracture. It is neurovascularly intact. Specialty Comments:  No specialty comments available.  Imaging: No results found.   PMFS History: Patient Active Problem List   Diagnosis Date Noted  . Nondisplaced fracture of lateral malleolus of left fibula, initial encounter for closed fracture 04/30/2017  . COPD exacerbation ([place]) 03/26/2016  . Sepsis ([place]) 03/25/2016  . Leg pain   . CAP (community acquired pneumonia) 03/23/2016  . Community acquired pneumonia   . UTI (lower urinary tract infection)   . Aftercare following surgery of the circulatory system, [place] 07/10/2013  . Occlusion and stenosis of carotid artery without mention of cerebral infarction 12/05/2012   Past Medical History:  Diagnosis Date  . Abnormal LFTs    secondary to cholesterol  medication  . Anxiety and depression   . Arthritis    Lower back," Osteo"  . Cancer [facility]) 1972   Nonmelanoma resection  .  Carotid artery occlusion    Bruit  . Diverticulitis   . Fibrocystic breast disease   . Glaucoma   . Hyperlipidemia   . Hypertension   . PONV (postoperative nausea and vomiting)   . S/P tonsillectomy and adenoidectomy 1975  . Shingles 2010    Family History  Problem Relation Age of Onset  . Stroke Father   . Heart attack Father     Past Surgical History:  Procedure Laterality Date  . ABDOMINAL HYSTERECTOMY  1980's  . APPENDECTOMY  1956  . CAROTID ENDARTERECTOMY    . CHOLECYSTECTOMY  1956  . [facility]ONOSCOPY  [facility]   [facility]  . ENDARTERECTOMY  12/23/2012   Procedure: ENDARTERECTOMY CAROTID;  Surgeon: Serafina Mitchell, MD;  Location: [facility] OR;  Service: Vascular;  Laterality: Right;  . ESOPHAGOGASTRODUODENOSCOPY     ?  Zenker's per the patient  . EYE SURGERY     Cataract  . PATCH ANGIOPLASTY  12/23/2012   Procedure: PATCH ANGIOPLASTY;  Surgeon: Serafina Mitchell, MD;  Location: [facility] OR;  Service: Vascular;  Laterality: Right;  Using 1 cm x 6 cm vascu guard patch  . TOE SURGERY     Onychomycosis  . TONSILLECTOMY AND ADENOIDECTOMY     Social History   Occupational History  . Not on file.   Social History Main Topics  . Smoking status: Never Smoker  . Smokeless tobacco: Never Used  . Alcohol use No  . Drug use: No  . Sexual activity: No

## 2017-05-03 ENCOUNTER — Inpatient Hospital Stay (HOSPITAL_COMMUNITY)
Admission: RE | Admit: 2017-05-03 | Discharge: 2017-05-03 | Disposition: A | Discharge status: Home / Self Care | Payer: Medicare Other | Source: Ambulatory Visit | Attending: Family | Admitting: Family

## 2017-05-03 ENCOUNTER — Ambulatory Visit: Payer: Medicare Other | Admitting: Family

## 2017-05-03 DIAGNOSIS — Z9889 Other specified postprocedural states: Secondary | ICD-10-CM

## 2017-05-03 DIAGNOSIS — Z48812 Encounter for surgical aftercare following surgery on the circulatory system: Secondary | ICD-10-CM

## 2017-05-03 DIAGNOSIS — I6523 Occlusion and stenosis of bilateral carotid arteries: Secondary | ICD-10-CM

## 2017-05-14 ENCOUNTER — Ambulatory Visit (INDEPENDENT_AMBULATORY_CARE_PROVIDER_SITE_OTHER): Payer: Medicare Other | Admitting: Orthopaedic Surgery

## 2017-05-14 ENCOUNTER — Ambulatory Visit (INDEPENDENT_AMBULATORY_CARE_PROVIDER_SITE_OTHER): Payer: Medicare Other

## 2017-05-14 DIAGNOSIS — S8265XA Nondisplaced fracture of lateral malleolus of left fibula, initial encounter for closed fracture: Secondary | ICD-10-CM

## 2017-05-14 NOTE — Progress Notes (Addendum)
Michaela Flynn follows up today for her minimally displaced fibular shaft fracture. She is been weightbearing with a cam walker with some pain. She is doing physical therapy at [facility]. She has a well fitting Cam Walker without any open wounds or ulcers. Her x-ray show stable alignment. At this point she can continue to weight-bear as tolerated in a cam walker with physical therapy. I'll like to see her back in 3 weeks with repeat x-rays of the left ankle. I do feel that she needs continued physical therapy at rehabilitation due to safety issues if she were to not get the appropriate amount of rehabilitation physical therapy. She'll be at significant risk for another fall resulting in more serious injuries.

## 2017-05-28 ENCOUNTER — Telehealth (INDEPENDENT_AMBULATORY_CARE_PROVIDER_SITE_OTHER): Payer: Self-pay | Admitting: Orthopaedic Surgery

## 2017-05-28 NOTE — Telephone Encounter (Signed)
Please advise I have already faxed your supporting notes, but they need you to dictate a quick note stating why 3 more weeks is necessary and needed.

## 2017-05-28 NOTE — Telephone Encounter (Signed)
CAN YOU PLEASE FAX PT VISIT NOTES SUPPORTING PT ORDER NEEDING 3 MORE WEEKS OF REHAB. AND ALSO CAN YOU PLEASE INFORM THAT AN OFFICIAL LETTER CAN BE DONE ON Monday WHEN DR. Erlinda Hong IS AVAILABLE TO DO SO.   FAX TO [phone] CASE NUMBER [id number] SM  FAX TO [place] AS WELL PLEASE TO [phone]

## 2017-05-31 ENCOUNTER — Encounter (INDEPENDENT_AMBULATORY_CARE_PROVIDER_SITE_OTHER): Payer: Self-pay

## 2017-05-31 NOTE — Telephone Encounter (Signed)
Michaela Flynn is a 83 year old female who has an ankle fracture. Patient needs aggressive physical therapy for mobilization, balance, home safety in order to safely mobilize and ambulate and to minimize fall risk.

## 2017-05-31 NOTE — Telephone Encounter (Signed)
I entered in a quick note

## 2017-05-31 NOTE — Telephone Encounter (Signed)
See below, they need a note JUST about this Not the office note Can you please quickly dictate something, thanks

## 2017-05-31 NOTE — Telephone Encounter (Signed)
Daughter came in stating rehab has been rejected by appeal and is requesting updated letter or note to re-apply. Needs to be faxed by 4:00 today.   FAX: [phone] 2nd appeal case number [id number] SM Also fax copy to [facility] at [phone]  Any questions, [phone] [place]-daughter

## 2017-05-31 NOTE — Telephone Encounter (Signed)
Its in the note

## 2017-05-31 NOTE — Telephone Encounter (Signed)
Faxed new note to provided numbers

## 2017-06-07 ENCOUNTER — Encounter (INDEPENDENT_AMBULATORY_CARE_PROVIDER_SITE_OTHER): Payer: Self-pay | Admitting: Orthopaedic Surgery

## 2017-06-07 ENCOUNTER — Ambulatory Visit (INDEPENDENT_AMBULATORY_CARE_PROVIDER_SITE_OTHER): Payer: Medicare Other

## 2017-06-07 ENCOUNTER — Ambulatory Visit (INDEPENDENT_AMBULATORY_CARE_PROVIDER_SITE_OTHER): Payer: Medicare Other | Admitting: Orthopaedic Surgery

## 2017-06-07 DIAGNOSIS — S8265XA Nondisplaced fracture of lateral malleolus of left fibula, initial encounter for closed fracture: Secondary | ICD-10-CM | POA: Diagnosis not present

## 2017-06-07 NOTE — Progress Notes (Signed)
Patient is 6 weeks status post nondisplaced fibula fracture. She has been weightbearing with a cam walker without pain. Her exam is stable. Her x-rays show stable alignment of the fibula fracture without any interval changes or worsening. At this point patient may transition to an ASO brace. Continue with physical therapy and strengthening. Follow up in 4 weeks with 3 view x-rays of the left ankle.

## 2017-06-14 ENCOUNTER — Encounter: Payer: Self-pay | Admitting: Family

## 2017-06-22 ENCOUNTER — Other Ambulatory Visit: Payer: Self-pay

## 2017-06-22 DIAGNOSIS — I6529 Occlusion and stenosis of unspecified carotid artery: Secondary | ICD-10-CM

## 2017-06-28 ENCOUNTER — Encounter (HOSPITAL_COMMUNITY): Payer: Medicare Other

## 2017-06-28 ENCOUNTER — Ambulatory Visit: Payer: Medicare Other | Admitting: Family

## 2017-07-05 ENCOUNTER — Encounter (INDEPENDENT_AMBULATORY_CARE_PROVIDER_SITE_OTHER): Payer: Self-pay | Admitting: Orthopaedic Surgery

## 2017-07-05 ENCOUNTER — Ambulatory Visit (INDEPENDENT_AMBULATORY_CARE_PROVIDER_SITE_OTHER): Payer: Medicare Other | Admitting: Orthopaedic Surgery

## 2017-07-05 ENCOUNTER — Ambulatory Visit (INDEPENDENT_AMBULATORY_CARE_PROVIDER_SITE_OTHER): Payer: Medicare Other

## 2017-07-05 DIAGNOSIS — S8265XA Nondisplaced fracture of lateral malleolus of left fibula, initial encounter for closed fracture: Secondary | ICD-10-CM

## 2017-07-05 NOTE — Progress Notes (Signed)
Patient is low over 2 months status post nondisplaced tibial fracture. She is doing well. Denies any pain or numbness and tingling. X-ray show healed fracture. Exam is benign. At this point discontinue Cam Walker. Physical therapy for strengthening. Follow-up as needed.

## 2017-07-06 ENCOUNTER — Telehealth (INDEPENDENT_AMBULATORY_CARE_PROVIDER_SITE_OTHER): Payer: Self-pay | Admitting: Orthopaedic Surgery

## 2017-07-06 NOTE — Telephone Encounter (Signed)
Raquel Sarna, physical therapist calling to get weight bearing status on patient (lt ankle) now that boot has been dc'd. Please call to advise.

## 2017-07-07 NOTE — Telephone Encounter (Signed)
Called Emily back and I advised WBAT

## 2017-07-07 NOTE — Telephone Encounter (Signed)
Tried to call but no answer

## 2017-07-07 NOTE — Telephone Encounter (Signed)
Please advise 

## 2017-07-07 NOTE — Telephone Encounter (Signed)
WBAT

## 2017-07-07 NOTE — Telephone Encounter (Signed)
Tried to call again and no answer

## 2017-07-21 ENCOUNTER — Emergency Department (HOSPITAL_COMMUNITY): Payer: Medicare Other

## 2017-07-21 ENCOUNTER — Emergency Department (HOSPITAL_COMMUNITY)
Admission: EM | Admit: 2017-07-21 | Discharge: 2017-07-21 | Disposition: A | Discharge status: Home / Self Care | Payer: Medicare Other | Attending: Emergency Medicine | Admitting: Emergency Medicine

## 2017-07-21 DIAGNOSIS — Z79899 Other long term (current) drug therapy: Secondary | ICD-10-CM | POA: Insufficient documentation

## 2017-07-21 DIAGNOSIS — J449 Chronic obstructive pulmonary disease, unspecified: Secondary | ICD-10-CM | POA: Diagnosis not present

## 2017-07-21 DIAGNOSIS — I10 Essential (primary) hypertension: Secondary | ICD-10-CM | POA: Insufficient documentation

## 2017-07-21 DIAGNOSIS — M25572 Pain in left ankle and joints of left foot: Secondary | ICD-10-CM

## 2017-07-21 DIAGNOSIS — Z859 Personal history of malignant neoplasm, unspecified: Secondary | ICD-10-CM | POA: Diagnosis not present

## 2017-07-21 DIAGNOSIS — Z7982 Long term (current) use of aspirin: Secondary | ICD-10-CM | POA: Insufficient documentation

## 2017-07-21 NOTE — ED Triage Notes (Signed)
Pt presents via EMS from [facility] after having L ankle pain. States she fell two days ago. Imaging from [facility] place show L ankle fracture. L ankle is normally swollen and red from edema. R ankle is wrapped from a wound. Alert but HOH.

## 2017-07-21 NOTE — ED Provider Notes (Signed)
[place] DEPT Provider Note   CSN: [id number] Arrival date & time: 07/21/17  1703     History   Chief Complaint Chief Complaint  Patient presents with  . Ankle Injury    HPI Michaela Flynn is a 83 y.o. female presented with 2 day history of left ankle pain.  Patient and family provided history. Patient fell in May 2018, and had a fracture of the left fibula. It was followed by [facility], and was healing well. She was seen on the 23rd by Dr. Erlinda Hong and was told she no longer needed the [facility]. Patient states that 2 days ago she fell when she was transferring from her wheelchair to a regular chair, however she was able to pick herself up and go back into her wheelchair. This was not witnessed, and no report was made. Patient reports continued left ankle pain after the fall for the past 2 days. Pain is present with ambulation and when weightbearing, but is relieved with rest. She's been taking her normal medications. Patient is ambulatory and has been doing physical therapy. Per family, patient had an x-ray done today and was told that she had a fracture of her left fibula. Patient reports she does not need anything for pain at this time. Patient denies injury elsewhere, and states she does not take blood thinners.  HPI  Past Medical History:  Diagnosis Date  . Abnormal LFTs    secondary to cholesterol  medication  . Anxiety and depression   . Arthritis    Lower back," Osteo"  . Cancer [facility]) 1972   Nonmelanoma resection  . Carotid artery occlusion    Bruit  . Diverticulitis   . Fibrocystic breast disease   . Glaucoma   . Hyperlipidemia   . Hypertension   . PONV (postoperative nausea and vomiting)   . S/P tonsillectomy and adenoidectomy 1975  . Shingles 2010    Patient Active Problem List   Diagnosis Date Noted  . Nondisplaced fracture of lateral malleolus of left fibula, initial encounter for closed fracture 04/30/2017  . COPD exacerbation ([place]) 03/26/2016  .  Sepsis ([place]) 03/25/2016  . Leg pain   . CAP (community acquired pneumonia) 03/23/2016  . Community acquired pneumonia   . UTI (lower urinary tract infection)   . Aftercare following surgery of the circulatory system, [place] 07/10/2013  . Occlusion and stenosis of carotid artery without mention of cerebral infarction 12/05/2012    Past Surgical History:  Procedure Laterality Date  . ABDOMINAL HYSTERECTOMY  1980's  . APPENDECTOMY  1956  . CAROTID ENDARTERECTOMY    . CHOLECYSTECTOMY  1956  . COLONOSCOPY  2008   [facility]  . ENDARTERECTOMY  12/23/2012   Procedure: ENDARTERECTOMY CAROTID;  Surgeon: Serafina Mitchell, MD;  Location: [facility] OR;  Service: Vascular;  Laterality: Right;  . ESOPHAGOGASTRODUODENOSCOPY     ?  Zenker's per the patient  . EYE SURGERY     Cataract  . PATCH ANGIOPLASTY  12/23/2012   Procedure: PATCH ANGIOPLASTY;  Surgeon: Serafina Mitchell, MD;  Location: [facility] OR;  Service: Vascular;  Laterality: Right;  Using 1 cm x 6 cm vascu guard patch  . TOE SURGERY     Onychomycosis  . TONSILLECTOMY AND ADENOIDECTOMY      OB History    No data available       Home Medications    Prior to Admission medications   Medication Sig Start Date End Date Taking? Authorizing Provider  amLODipine (NORVASC)  2.5 MG tablet Take 1 tablet (2.5 mg total) by mouth daily. 03/28/16  Yes Tat, Shanon Brow, MD  aspirin 325 MG tablet Take 325 mg by mouth daily at 12 noon.    Yes [provider]  BACITRACIN ZINC EX Apply 1 application topically daily. Apply to skin tear on right leg   Yes [provider]  benazepril-hydrochlorthiazide (LOTENSIN HCT) 20-12.5 MG tablet Take 1 tablet by mouth every morning.  01/27/16  Yes [provider]  Cholecalciferol (VITAMIN D) 2000 units tablet Take 2,000 Units by mouth daily.   Yes [provider]  ciprofloxacin (CIPRO) 250 MG tablet Take 250 mg by mouth every 12 (twelve) hours.   Yes [provider]  clonazePAM  (KLONOPIN) 0.25 MG disintegrating tablet Take 0.25 mg by mouth at bedtime.   Yes [provider]  clotrimazole (LOTRIMIN) 1 % cream Apply 1 application topically 2 (two) times daily.   Yes [provider]  escitalopram (LEXAPRO) 10 MG tablet Take 10 mg by mouth every morning.    Yes [provider]  furosemide (LASIX) 20 MG tablet Take 60 mg by mouth daily.    Yes [provider]  gabapentin (NEURONTIN) 100 MG capsule Take 100 mg by mouth 3 (three) times daily.  02/03/16  Yes [provider]  metoprolol tartrate (LOPRESSOR) 25 MG tablet Take 25 mg by mouth 2 (two) times daily.   Yes [provider]  nystatin (NYSTATIN) powder Apply 1 Bottle topically 2 (two) times daily.   Yes [provider]  Polyethyl Glycol-Propyl Glycol (SYSTANE PRESERVATIVE FREE) 0.4-0.3 % SOLN Place 1 drop into both eyes 2 (two) times daily. For dry eyes   Yes [provider]  Probiotic Product (PROBIOTIC PO) Take 2 capsules by mouth daily.   Yes [provider]  traMADol (ULTRAM) 50 MG tablet Take 1 tablet (50 mg total) by mouth every 6 (six) hours as needed. For pain 03/27/16  Yes Tat, Shanon Brow, MD  travoprost, benzalkonium, (TRAVATAN) 0.004 % ophthalmic solution Place 1 drop into both eyes at bedtime.    Yes [provider]  albuterol (PROVENTIL HFA;VENTOLIN HFA) 108 (90 Base) MCG/ACT inhaler Inhale 2 puffs into the lungs every 6 (six) hours as needed for wheezing or shortness of breath. 03/27/16   Orson Eva, MD  clonazePAM (KLONOPIN) 0.5 MG tablet Take 0.5 tablets (0.25 mg total) by mouth at bedtime. Patient not taking: Reported on 07/21/2017 03/27/16   Orson Eva, MD  simvastatin (ZOCOR) 10 MG tablet Take 10 mg by mouth 3 (three) times a week.    [provider]    Family History Family History  Problem Relation Age of Onset  . Stroke Father   . Heart attack Father     Social History Social History  Substance Use Topics  .  Smoking status: Never Smoker  . Smokeless tobacco: Never Used  . Alcohol use No     Allergies   Amlodipine   Review of Systems Review of Systems  Musculoskeletal: Positive for arthralgias. Negative for back pain and neck pain.  Skin: Negative for wound.  Neurological: Negative for numbness.  Hematological: Does not bruise/bleed easily.     Physical Exam Updated Vital Signs BP 138/66 (BP Location: Right Arm)   Pulse 73   Temp 98.5 F (36.9 C) (Oral)   Resp 14   SpO2 92%   Physical Exam  Constitutional: She is oriented to person, place, and time. She appears well-developed and well-nourished. No distress.  HENT:  Head: Normocephalic and atraumatic.  Eyes: EOM are normal.  Neck: Normal range of motion.  Cardiovascular: Normal rate, regular rhythm and intact distal pulses.   Pulmonary/Chest: Effort normal and breath sounds normal. No respiratory distress. She has no wheezes.  Abdominal: She exhibits no distension. There is no tenderness.  Musculoskeletal: Normal range of motion.  Swelling of L lower ext from foot to knee. No increased swelling or bruising of alteral L ankle. Chronic skin changes noted. Pt with full active ROM of ankle and toes without pain. TTP of distal fibula. Sensation intact bilaterally. Cpmpartments soft. R foot wrapped with ace wrap.   Neurological: She is alert and oriented to person, place, and time.  Skin: Skin is warm. No rash noted.  Psychiatric: She has a normal mood and affect.  Nursing note and vitals reviewed.    ED Treatments / Results  Labs (all labs ordered are listed, but only abnormal results are displayed) Labs Reviewed - No data to display  EKG  EKG Interpretation None       Radiology Dg Ankle Complete Left  Result Date: 07/21/2017 CLINICAL DATA:  Fall EXAM: LEFT ANKLE COMPLETE - 3+ VIEW COMPARISON:  None. FINDINGS: There is a minimally displaced fracture of the distal fibula which is above the tibial plafond. There is  significant soft tissue swelling about the lateral malleolus. There is minimal spurring at the inferior calcaneus. Talus and calcaneus are grossly intact. Tibia is grossly intact. IMPRESSION: Minimally displaced distal fibular fracture associated with soft tissue swelling. Electronically Signed   By: Marybelle Killings M.D.   On: 07/21/2017 19:36    Procedures Procedures (including critical care time)  Medications Ordered in ED Medications - No data to display   Initial Impression / Assessment and Plan / ED Course  I have reviewed the triage vital signs and the nursing notes.  Pertinent labs & imaging results that were available during my care of the patient were reviewed by me and considered in my medical decision making (see chart for details).     Pt presenting with L ankle pain worsened 2 days ago. Prior fx at this location. Physical exam reassuring, as pt has full ROM and has been ambulatory. Neurovascularly intact with soft compartments. Will order xray.   Xray shows healing fx at location of previous injury. Likely soft tissue pain at the site. Case discussed with attending, and Dr. Tomi Bamberger evaluated the pt. Will use ace wrap to provide support and help with swelling. Discussed findings with pt and family. Discussed conservative measures. Pt to f/u with orthopedics and PCP if necessary. Return precautions given. Pt and family state they understand and agree to plan.    Final Clinical Impressions(s) / ED Diagnoses   Final diagnoses:  Acute left ankle pain    New Prescriptions Discharge Medication List as of 07/21/2017  8:43 PM       Franchot Heidelberg, PA-C 07/22/17 0037    Dorie Rank, MD 07/22/17 1429

## 2017-07-21 NOTE — Discharge Instructions (Signed)
Continue to take your at-home medications. Use ice as needed for symptom control and swelling. You may use an Ace wrap for symptom control and swelling. Follow-up with orthopedics for further evaluation of your ankle pain. If you need further assistance with pain control following orthopedics, you may follow-up with your primary care doctor for further evaluation of the swelling and pain. Return to the emergency department if you develop numbness, tingling, worsening pain, or any new or worsening symptoms.

## 2017-07-21 NOTE — ED Notes (Signed)
Bed: WA16 Expected date:  Expected time:  Means of arrival:  Comments: EMS 

## 2017-07-21 NOTE — ED Notes (Signed)
[facility] called for discharge transport 

## 2017-07-23 ENCOUNTER — Encounter (HOSPITAL_COMMUNITY): Payer: Self-pay | Admitting: Emergency Medicine

## 2017-07-23 ENCOUNTER — Emergency Department (HOSPITAL_COMMUNITY): Payer: Medicare Other

## 2017-07-23 ENCOUNTER — Emergency Department (HOSPITAL_COMMUNITY)
Admission: EM | Admit: 2017-07-23 | Discharge: 2017-07-23 | Disposition: A | Discharge status: Home / Self Care | Payer: Medicare Other | Attending: Emergency Medicine | Admitting: Emergency Medicine

## 2017-07-23 DIAGNOSIS — Y999 Unspecified external cause status: Secondary | ICD-10-CM | POA: Diagnosis not present

## 2017-07-23 DIAGNOSIS — Z7982 Long term (current) use of aspirin: Secondary | ICD-10-CM | POA: Diagnosis not present

## 2017-07-23 DIAGNOSIS — Y92121 Bathroom in nursing home as the place of occurrence of the external cause: Secondary | ICD-10-CM | POA: Insufficient documentation

## 2017-07-23 DIAGNOSIS — F0390 Unspecified dementia without behavioral disturbance: Secondary | ICD-10-CM | POA: Diagnosis not present

## 2017-07-23 DIAGNOSIS — S99912A Unspecified injury of left ankle, initial encounter: Secondary | ICD-10-CM | POA: Diagnosis present

## 2017-07-23 DIAGNOSIS — I10 Essential (primary) hypertension: Secondary | ICD-10-CM | POA: Diagnosis not present

## 2017-07-23 DIAGNOSIS — I2510 Atherosclerotic heart disease of native coronary artery without angina pectoris: Secondary | ICD-10-CM | POA: Diagnosis not present

## 2017-07-23 DIAGNOSIS — S82892A Other fracture of left lower leg, initial encounter for closed fracture: Secondary | ICD-10-CM

## 2017-07-23 DIAGNOSIS — S8255XA Nondisplaced fracture of medial malleolus of left tibia, initial encounter for closed fracture: Secondary | ICD-10-CM | POA: Insufficient documentation

## 2017-07-23 DIAGNOSIS — Y939 Activity, unspecified: Secondary | ICD-10-CM | POA: Diagnosis not present

## 2017-07-23 DIAGNOSIS — J449 Chronic obstructive pulmonary disease, unspecified: Secondary | ICD-10-CM | POA: Diagnosis not present

## 2017-07-23 DIAGNOSIS — W19XXXA Unspecified fall, initial encounter: Secondary | ICD-10-CM | POA: Diagnosis not present

## 2017-07-23 MED ORDER — TRAMADOL HCL 50 MG PO TABS
50.0000 mg | ORAL_TABLET | Freq: Once | ORAL | Status: AC
  Administered 2017-07-23: 50 mg via ORAL
  Filled 2017-07-23: qty 1

## 2017-07-23 NOTE — ED Triage Notes (Signed)
Per EMS, pt coming from [facility] with a unwitnessed fall yesterday. [facility] X-rayed yesterday and showed fracture. Pt AO X2 per baseline. Pt complains of left ankle pain & per ems left ankle blue and swollen.

## 2017-07-23 NOTE — ED Notes (Signed)
[facility] called @ 11:55.

## 2017-07-23 NOTE — ED Notes (Signed)
Bed: TA68 Expected date:  Expected time:  Means of arrival:  Comments: EMS 83 y/o leg injury

## 2017-07-23 NOTE — ED Provider Notes (Signed)
[place] DEPT Provider Note   CSN: [id number] Arrival date & time: 07/23/17  0905     History   Chief Complaint Chief Complaint  Patient presents with  . Ankle Injury (left)    HPI Michaela Flynn is a 83 y.o. female.  HPI  83 year old female presents from her nursing facility with concern for an new fracture of her ankle. The patient has a very difficult time hearing and has dementia and so the history is provided from the son at the bedside. In May 2018 she broke her left ankle. She was treated nonoperatively by Dr. Erlinda Hong. Transition to a [facility] and now is on nothing. However she is not supposed to walk at home and frequent tries to do this. She was seen here 2 days ago after a fall and sent in for concern for a new fracture. At that time it was determined that the fracture was healing and better than before. Thus sent back to her facility. However the son states that she was found on the bathroom floor yesterday and they did an x-ray of her ankle that shows a new fracture. She was sent in this morning for evaluation. The patient denies any complaints until her ankle was evaluated and then is complaining of ankle pain. No headache or neck pain.  Past Medical History:  Diagnosis Date  . Abnormal LFTs    secondary to cholesterol  medication  . Anxiety and depression   . Arthritis    Lower back," Osteo"  . Cancer [facility]) 1972   Nonmelanoma resection  . Carotid artery occlusion    Bruit  . Diverticulitis   . Fibrocystic breast disease   . Glaucoma   . Hyperlipidemia   . Hypertension   . PONV (postoperative nausea and vomiting)   . S/P tonsillectomy and adenoidectomy 1975  . Shingles 2010    Patient Active Problem List   Diagnosis Date Noted  . Nondisplaced fracture of lateral malleolus of left fibula, initial encounter for closed fracture 04/30/2017  . COPD exacerbation ([place]) 03/26/2016  . Sepsis ([place]) 03/25/2016  . Leg pain   . CAP (community acquired pneumonia)  03/23/2016  . Community acquired pneumonia   . UTI (lower urinary tract infection)   . Aftercare following surgery of the circulatory system, [place] 07/10/2013  . Occlusion and stenosis of carotid artery without mention of cerebral infarction 12/05/2012    Past Surgical History:  Procedure Laterality Date  . ABDOMINAL HYSTERECTOMY  1980's  . APPENDECTOMY  1956  . CAROTID ENDARTERECTOMY    . CHOLECYSTECTOMY  1956  . COLONOSCOPY  2008   [facility]  . ENDARTERECTOMY  12/23/2012   Procedure: ENDARTERECTOMY CAROTID;  Surgeon: Serafina Mitchell, MD;  Location: [facility] OR;  Service: Vascular;  Laterality: Right;  . ESOPHAGOGASTRODUODENOSCOPY     ?  Zenker's per the patient  . EYE SURGERY     Cataract  . PATCH ANGIOPLASTY  12/23/2012   Procedure: PATCH ANGIOPLASTY;  Surgeon: Serafina Mitchell, MD;  Location: [facility] OR;  Service: Vascular;  Laterality: Right;  Using 1 cm x 6 cm vascu guard patch  . TOE SURGERY     Onychomycosis  . TONSILLECTOMY AND ADENOIDECTOMY      OB History    No data available       Home Medications    Prior to Admission medications   Medication Sig Start Date End Date Taking? Authorizing Provider  albuterol (PROVENTIL HFA;VENTOLIN HFA) 108 (90 Base) MCG/ACT inhaler Inhale  2 puffs into the lungs every 6 (six) hours as needed for wheezing or shortness of breath. 03/27/16  Yes Tat, Shanon Brow, MD  aspirin 325 MG tablet Take 325 mg by mouth daily at 12 noon.    Yes [provider]  BACITRACIN ZINC EX Apply 1 application topically daily. Apply to skin tear on right leg   Yes [provider]  benazepril-hydrochlorthiazide (LOTENSIN HCT) 20-12.5 MG tablet Take 1 tablet by mouth every morning.  01/27/16  Yes [provider]  Cholecalciferol (VITAMIN D) 2000 units tablet Take 2,000 Units by mouth daily.   Yes [provider]  ciprofloxacin (CIPRO) 250 MG tablet Take 250 mg by mouth every 12 (twelve) hours.   Yes [provider]  clonazePAM  (KLONOPIN) 0.25 MG disintegrating tablet Take 0.25 mg by mouth at bedtime.   Yes [provider]  clotrimazole (LOTRIMIN) 1 % cream Apply 1 application topically 2 (two) times daily.   Yes [provider]  escitalopram (LEXAPRO) 10 MG tablet Take 10 mg by mouth every morning.    Yes [provider]  furosemide (LASIX) 20 MG tablet Take 60 mg by mouth daily.    Yes [provider]  furosemide (LASIX) 80 MG tablet Take 80 mg by mouth daily. 07/22/17  Yes [provider]  gabapentin (NEURONTIN) 100 MG capsule Take 100 mg by mouth 3 (three) times daily.  02/03/16  Yes [provider]  metoprolol tartrate (LOPRESSOR) 25 MG tablet Take 25 mg by mouth 2 (two) times daily.   Yes [provider]  nystatin (NYSTATIN) powder Apply 1 Bottle topically 2 (two) times daily.   Yes [provider]  Polyethyl Glycol-Propyl Glycol (SYSTANE PRESERVATIVE FREE) 0.4-0.3 % SOLN Place 1 drop into both eyes 2 (two) times daily. For dry eyes   Yes [provider]  Probiotic Product (PROBIOTIC PO) Take 2 capsules by mouth daily.   Yes [provider]  simvastatin (ZOCOR) 10 MG tablet Take 10 mg by mouth 3 (three) times a week.   Yes [provider]  traMADol (ULTRAM) 50 MG tablet Take 1 tablet (50 mg total) by mouth every 6 (six) hours as needed. For pain 03/27/16  Yes Tat, Shanon Brow, MD  travoprost, benzalkonium, (TRAVATAN) 0.004 % ophthalmic solution Place 1 drop into both eyes at bedtime.    Yes [provider]  amoxicillin-clavulanate (AUGMENTIN) 875-125 MG tablet Take 1 tablet by mouth 2 (two) times daily. 07/22/17   [provider]    Family History Family History  Problem Relation Age of Onset  . Stroke Father   . Heart attack Father     Social History Social History  Substance Use Topics  . Smoking status: Never Smoker  . Smokeless tobacco: Never Used  . Alcohol use No     Allergies    Amlodipine   Review of Systems Review of Systems  Musculoskeletal: Positive for arthralgias and joint swelling. Negative for neck pain.  Skin: Negative for wound.  Neurological: Negative for headaches.  All other systems reviewed and are negative.    Physical Exam Updated Vital Signs BP 123/62 (BP Location: Left Arm)   Pulse 70   Temp 98.1 F (36.7 C) (Oral)   Resp 16   Wt 83.9 kg (185 lb)   SpO2 97%   BMI 32.77 kg/m   Physical Exam  Constitutional: She is oriented to person, place, and time. She appears well-developed and well-nourished.  HENT:  Head: Normocephalic and atraumatic.  Right  Ear: External ear normal.  Left Ear: External ear normal.  Nose: Nose normal.  Eyes: Right eye exhibits no discharge. Left eye exhibits no discharge.  Cardiovascular: Normal rate and regular rhythm.   Pulses:      Dorsalis pedis pulses are 2+ on the left side.  Pulmonary/Chest: Effort normal.  Musculoskeletal:       Left ankle: She exhibits decreased range of motion (painful) and swelling. Tenderness.       Left lower leg: She exhibits tenderness (distally) and swelling.       Left foot: There is tenderness and swelling.  RLE in unna boot Chronic skin changes to LLE  Neurological: She is alert and oriented to person, place, and time.  Skin: Skin is warm and dry.  Nursing note and vitals reviewed.    ED Treatments / Results  Labs (all labs ordered are listed, but only abnormal results are displayed) Labs Reviewed - No data to display  EKG  EKG Interpretation None       Radiology Dg Tibia/fibula Left  Result Date: 07/23/2017 CLINICAL DATA:  Unwitnessed fall at skilled nursing facility yesterday and complains of lt lower leg, lt foot pain with sts, bruising lt ankle area EXAM: LEFT TIBIA AND FIBULA - 2 VIEW COMPARISON:  07/21/2017 FINDINGS: There is diffuse swelling of the lower leg. There is an new fracture of the medial malleolus. Slightly displaced oblique fracture of  the distal fibula is again noted. Posterior malleolus is intact. Proximal tibia and fibula are intact. IMPRESSION: New fracture of the medial malleolus. Increased displacement of the known fibular fracture. Electronically Signed   By: Nolon Nations M.D.   On: 07/23/2017 10:17   Dg Ankle Complete Left  Result Date: 07/23/2017 CLINICAL DATA:  Unwitnessed fall at skilled nursing facility yesterday and complains of lt lower leg, lt foot pain with sts, bruising lt ankle area EXAM: LEFT ANKLE COMPLETE - 3+ VIEW COMPARISON:  07/21/2017 FINDINGS: There is diffuse soft tissue swelling of the lower leg. There is an acute fracture of the medial malleolus. Acute fracture again noted of the lateral malleolus, slightly more displaced compared with prior study. Posterior malleolus is intact. IMPRESSION: 1. Interval fracture of the medial malleolus. 2. Slightly displaced fracture of the distal fibula. 3. Diffuse soft tissue swelling of the lower leg. Electronically Signed   By: Nolon Nations M.D.   On: 07/23/2017 10:15   Dg Ankle Complete Left  Result Date: 07/21/2017 CLINICAL DATA:  Fall EXAM: LEFT ANKLE COMPLETE - 3+ VIEW COMPARISON:  None. FINDINGS: There is a minimally displaced fracture of the distal fibula which is above the tibial plafond. There is significant soft tissue swelling about the lateral malleolus. There is minimal spurring at the inferior calcaneus. Talus and calcaneus are grossly intact. Tibia is grossly intact. IMPRESSION: Minimally displaced distal fibular fracture associated with soft tissue swelling. Electronically Signed   By: Marybelle Killings M.D.   On: 07/21/2017 19:36   Dg Foot Complete Left  Result Date: 07/23/2017 CLINICAL DATA:  Unwitnessed fall at SNF yesterday and complains of lt lower leg, lt foot pain with sts, bruising lt ankle area EXAM: LEFT FOOT - COMPLETE 3+ VIEW COMPARISON:  04/24/2017 FINDINGS: There is significant soft tissue swelling along the dorsal aspect of the  metatarsals. No acute fracture or subluxation. Degenerative changes are identified in the midfoot. There is a plantar calcaneal spur. IMPRESSION: Significant soft tissue swelling.  No acute fracture. Electronically Signed   By: Nolon Nations M.D.  On: 07/23/2017 10:20    Procedures Procedures (including critical care time)  Medications Ordered in ED Medications  traMADol (ULTRAM) tablet 50 mg (50 mg Oral Given 07/23/17 1025)     Initial Impression / Assessment and Plan / ED Course  I have reviewed the triage vital signs and the nursing notes.  Pertinent labs & imaging results that were available during my care of the patient were reviewed by me and considered in my medical decision making (see chart for details).     Patient's x-rays are consistent with new/progressive and acute ankle fractures. Appears neurovascular intact. She was given tramadol for pain which she takes at home. Updated her orthopedist, Dr. Erlinda Hong, who agrees with splint and outpatient follow-up. Son updated as well. Patient will be discharged back to her facility and will need to be nonweightbearing.  Final Clinical Impressions(s) / ED Diagnoses   Final diagnoses:  Closed fracture of left ankle, initial encounter    New Prescriptions New Prescriptions   No medications on file     Sherwood Gambler, MD 07/23/17 1122

## 2017-08-02 ENCOUNTER — Ambulatory Visit (INDEPENDENT_AMBULATORY_CARE_PROVIDER_SITE_OTHER): Payer: Medicare Other

## 2017-08-02 ENCOUNTER — Ambulatory Visit (INDEPENDENT_AMBULATORY_CARE_PROVIDER_SITE_OTHER): Payer: Medicare Other | Admitting: Orthopaedic Surgery

## 2017-08-02 DIAGNOSIS — S8262XA Displaced fracture of lateral malleolus of left fibula, initial encounter for closed fracture: Secondary | ICD-10-CM

## 2017-08-02 NOTE — Progress Notes (Signed)
Office Visit Note   Patient: Michaela Flynn           Date of Birth: 1925-01-04           MRN: [id number] Visit Date: 08/02/2017              Requested by: Jani Gravel, MD [address] [place], [place] [address] PCP: Jani Gravel, MD   Assessment & Plan: Visit Diagnoses:  1. Closed displaced fracture of lateral malleolus of left fibula, initial encounter     Plan: Patient's overall soft tissue envelope is not conducive to surgery. She is a low demand elderly female. We will plan on treating this nonoperatively. Follow up in a week for repeat 3 view x-rays of the left ankle. Cam Walker for now. Strict nonweightbearing.  Follow-Up Instructions: Return in about 1 week (around 08/09/2017).   Orders:  Orders Placed This Encounter  Procedures  . XR Ankle Complete Left   No orders of the defined types were placed in this encounter.     Procedures: No procedures performed   Clinical Data: No additional findings.   Subjective: No chief complaint on file.   Patient comes in today with a new left lateral malleolus fracture. She had recently just healed and recovered from a nondisplaced fibula fracture. She had a mechanical fall. This happened about 10 days ago.    Review of Systems  Constitutional: Negative.   HENT: Negative.   Eyes: Negative.   Respiratory: Negative.   Cardiovascular: Negative.   Endocrine: Negative.   Musculoskeletal: Negative.   Neurological: Negative.   Hematological: Negative.   Psychiatric/Behavioral: Negative.   All other systems reviewed and are negative.    Objective: Vital Signs: There were no vitals taken for this visit.  Physical Exam  Constitutional: She is oriented to person, place, and time. She appears well-developed and well-nourished.  Pulmonary/Chest: Effort normal.  Neurological: She is alert and oriented to person, place, and time.  Skin: Skin is warm. Capillary refill takes less than 2 seconds.  Psychiatric: She  has a normal mood and affect. Her behavior is normal. Judgment and thought content normal.  Nursing note and vitals reviewed.   Ortho Exam Left ankle exam shows very thin skin. She has mild swelling. This is tender over the lateral malleolus. Foot is warm well-perfused. Specialty Comments:  No specialty comments available.  Imaging: No results found.   PMFS History: Patient Active Problem List   Diagnosis Date Noted  . Nondisplaced fracture of lateral malleolus of left fibula, initial encounter for closed fracture 04/30/2017  . COPD exacerbation ([place]) 03/26/2016  . Sepsis ([place]) 03/25/2016  . Leg pain   . CAP (community acquired pneumonia) 03/23/2016  . Community acquired pneumonia   . UTI (lower urinary tract infection)   . Aftercare following surgery of the circulatory system, [place] 07/10/2013  . Occlusion and stenosis of carotid artery without mention of cerebral infarction 12/05/2012   Past Medical History:  Diagnosis Date  . Abnormal LFTs    secondary to cholesterol  medication  . Anxiety and depression   . Arthritis    Lower back," Osteo"  . Cancer [facility]) 1972   Nonmelanoma resection  . Carotid artery occlusion    Bruit  . Diverticulitis   . Fibrocystic breast disease   . Glaucoma   . Hyperlipidemia   . Hypertension   . PONV (postoperative nausea and vomiting)   . S/P tonsillectomy and adenoidectomy 1975  . Shingles 2010  Family History  Problem Relation Age of Onset  . Stroke Father   . Heart attack Father     Past Surgical History:  Procedure Laterality Date  . ABDOMINAL HYSTERECTOMY  1980's  . APPENDECTOMY  1956  . CAROTID ENDARTERECTOMY    . CHOLECYSTECTOMY  1956  . COLONOSCOPY  2008   [facility]  . ENDARTERECTOMY  12/23/2012   Procedure: ENDARTERECTOMY CAROTID;  Surgeon: Serafina Mitchell, MD;  Location: [facility] OR;  Service: Vascular;  Laterality: Right;  . ESOPHAGOGASTRODUODENOSCOPY     ?  Zenker's per the patient  . EYE SURGERY     Cataract   . PATCH ANGIOPLASTY  12/23/2012   Procedure: PATCH ANGIOPLASTY;  Surgeon: Serafina Mitchell, MD;  Location: [facility] OR;  Service: Vascular;  Laterality: Right;  Using 1 cm x 6 cm vascu guard patch  . TOE SURGERY     Onychomycosis  . TONSILLECTOMY AND ADENOIDECTOMY     Social History   Occupational History  . Not on file.   Social History Main Topics  . Smoking status: Never Smoker  . Smokeless tobacco: Never Used  . Alcohol use No  . Drug use: No  . Sexual activity: No

## 2017-08-09 ENCOUNTER — Telehealth (INDEPENDENT_AMBULATORY_CARE_PROVIDER_SITE_OTHER): Payer: Self-pay | Admitting: Radiology

## 2017-08-09 ENCOUNTER — Ambulatory Visit (INDEPENDENT_AMBULATORY_CARE_PROVIDER_SITE_OTHER): Payer: Medicare Other

## 2017-08-09 ENCOUNTER — Encounter (INDEPENDENT_AMBULATORY_CARE_PROVIDER_SITE_OTHER): Payer: Self-pay | Admitting: Orthopaedic Surgery

## 2017-08-09 ENCOUNTER — Ambulatory Visit (INDEPENDENT_AMBULATORY_CARE_PROVIDER_SITE_OTHER): Payer: Medicare Other | Admitting: Orthopaedic Surgery

## 2017-08-09 DIAGNOSIS — S8265XA Nondisplaced fracture of lateral malleolus of left fibula, initial encounter for closed fracture: Secondary | ICD-10-CM

## 2017-08-09 NOTE — Telephone Encounter (Signed)
Requested office note with patient instructions, paper work was not filled when returning to facility.   Faxed information to [phone]

## 2017-08-09 NOTE — Progress Notes (Signed)
Patient follows up today for her bimalleolar ankle fracture. She is nonweightbearing. Her exam is benign. X-ray show stable alignment of the bimalleolar ankle fracture. At this point continue with nonweightbearing for 4 more weeks. Follow-up at that time with repeat 3 view x-rays of left ankle. May consider putting her in an air splint with weightbearing.

## 2017-08-11 ENCOUNTER — Encounter: Payer: Self-pay | Admitting: Family

## 2017-08-23 ENCOUNTER — Encounter (HOSPITAL_COMMUNITY): Payer: Medicare Other

## 2017-08-23 ENCOUNTER — Ambulatory Visit: Payer: Medicare Other | Admitting: Family

## 2017-09-06 ENCOUNTER — Ambulatory Visit (INDEPENDENT_AMBULATORY_CARE_PROVIDER_SITE_OTHER): Payer: Medicare Other | Admitting: Orthopaedic Surgery

## 2017-09-06 ENCOUNTER — Ambulatory Visit (INDEPENDENT_AMBULATORY_CARE_PROVIDER_SITE_OTHER): Payer: Medicare Other

## 2017-09-06 ENCOUNTER — Encounter (INDEPENDENT_AMBULATORY_CARE_PROVIDER_SITE_OTHER): Payer: Self-pay | Admitting: Orthopaedic Surgery

## 2017-09-06 DIAGNOSIS — S8265XA Nondisplaced fracture of lateral malleolus of left fibula, initial encounter for closed fracture: Secondary | ICD-10-CM | POA: Diagnosis not present

## 2017-09-06 NOTE — Progress Notes (Signed)
Patient follows up today for her bimalleolar ankle fracture. Overall she is doing well in terms of pain.  X-ray show stable alignment of the fracture with evidence of progressive healing.  At this point she can ambulate and weight-bear as tolerated in a Cam Walker and wean as tolerated per physical therapy. Follow-up in 6 weeks with repeat 3 view x-rays of the left ankle.

## 2017-09-09 ENCOUNTER — Telehealth (INDEPENDENT_AMBULATORY_CARE_PROVIDER_SITE_OTHER): Payer: Self-pay

## 2017-09-09 NOTE — Telephone Encounter (Signed)
Faxed to [phone].

## 2017-09-09 NOTE — Telephone Encounter (Signed)
Janett Billow with [facility] would like for last office note to be faxed to [phone].  Cb# is [phone].  Please advise.  Thank you.

## 2017-10-18 ENCOUNTER — Encounter (INDEPENDENT_AMBULATORY_CARE_PROVIDER_SITE_OTHER): Payer: Self-pay | Admitting: Orthopaedic Surgery

## 2017-10-18 ENCOUNTER — Ambulatory Visit (INDEPENDENT_AMBULATORY_CARE_PROVIDER_SITE_OTHER): Payer: Medicare Other | Admitting: Orthopaedic Surgery

## 2017-10-18 ENCOUNTER — Ambulatory Visit (INDEPENDENT_AMBULATORY_CARE_PROVIDER_SITE_OTHER): Payer: Medicare Other

## 2017-10-18 DIAGNOSIS — S8265XA Nondisplaced fracture of lateral malleolus of left fibula, initial encounter for closed fracture: Secondary | ICD-10-CM | POA: Diagnosis not present

## 2017-10-18 NOTE — Progress Notes (Signed)
Patient is almost 3 months status post minimally displaced bimalleolar ankle fracture.  She is not complaining of any pain.  She has minimal swelling of her left ankle and foot.  Foot is nontender.  Ankle is nontender.  X-rays show a healed bimalleolar ankle fracture.  At this point ASO brace was provided today.  Continue physical therapy for strengthening and gait training.  Questions encouraged and answered.  Follow-up as needed.

## 2017-12-27 ENCOUNTER — Ambulatory Visit: Payer: Medicare Other | Admitting: Family

## 2017-12-27 ENCOUNTER — Encounter (HOSPITAL_COMMUNITY): Payer: Medicare Other

## 2018-06-07 IMAGING — CR DG HIP (WITH OR WITHOUT PELVIS) 2-3V*R*
3 series · 3 of 3 positions shown · non-contrast
Comparison: None.

CLINICAL DATA: 89 y/o  F; status post fall with right hip pain.

EXAM:
DG HIP (WITH OR WITHOUT PELVIS) 2-3V RIGHT

[x pelvis (1 of 3)]
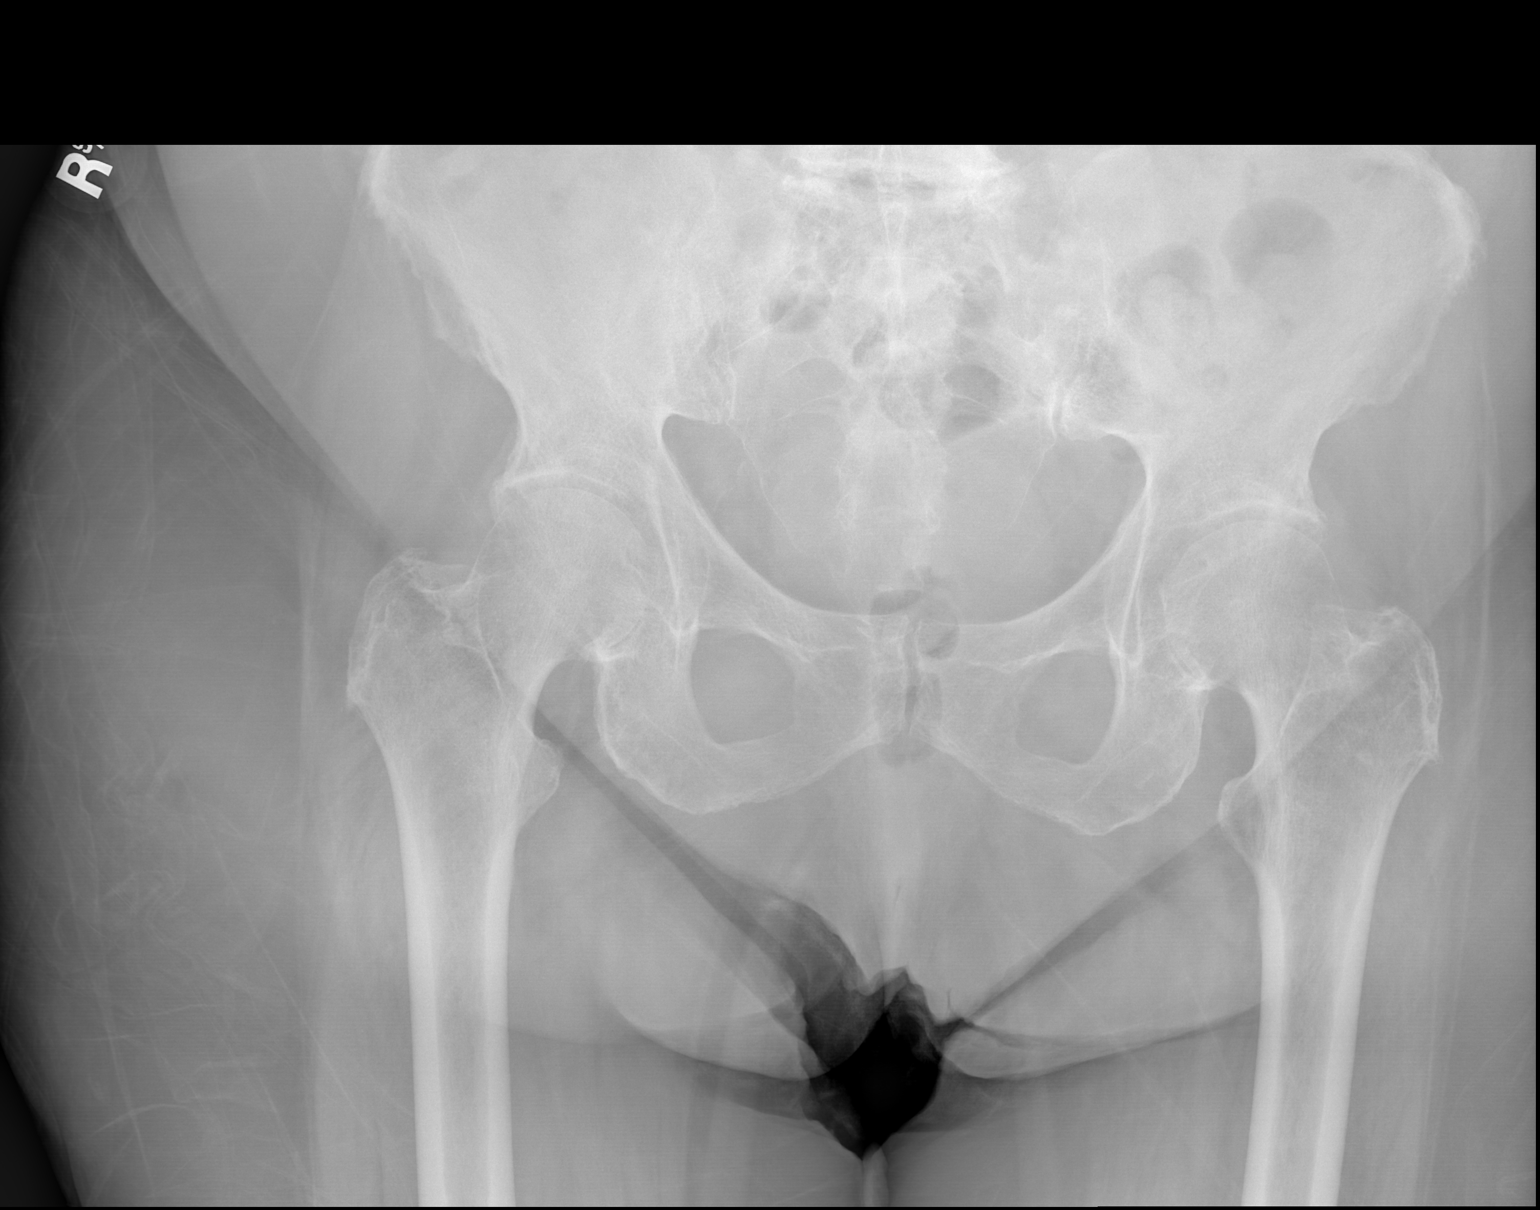

[x pelvis (2 of 3)]
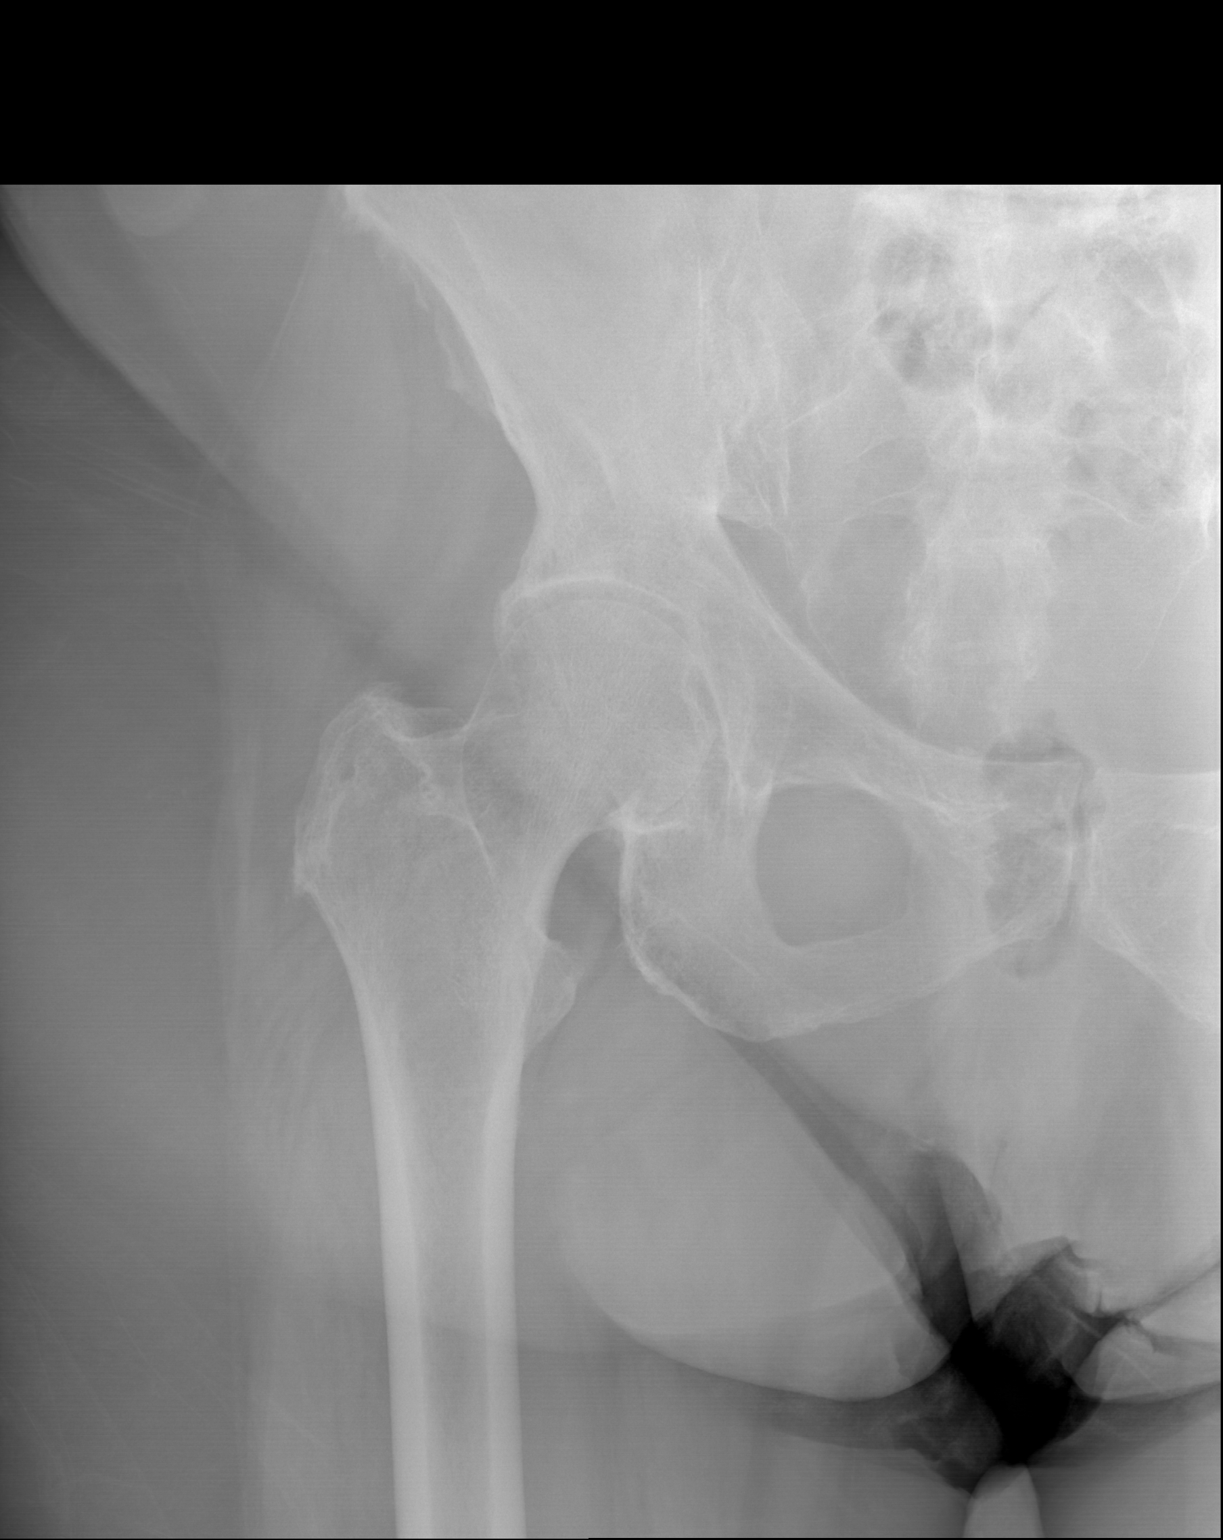

[x pelvis (3 of 3)]
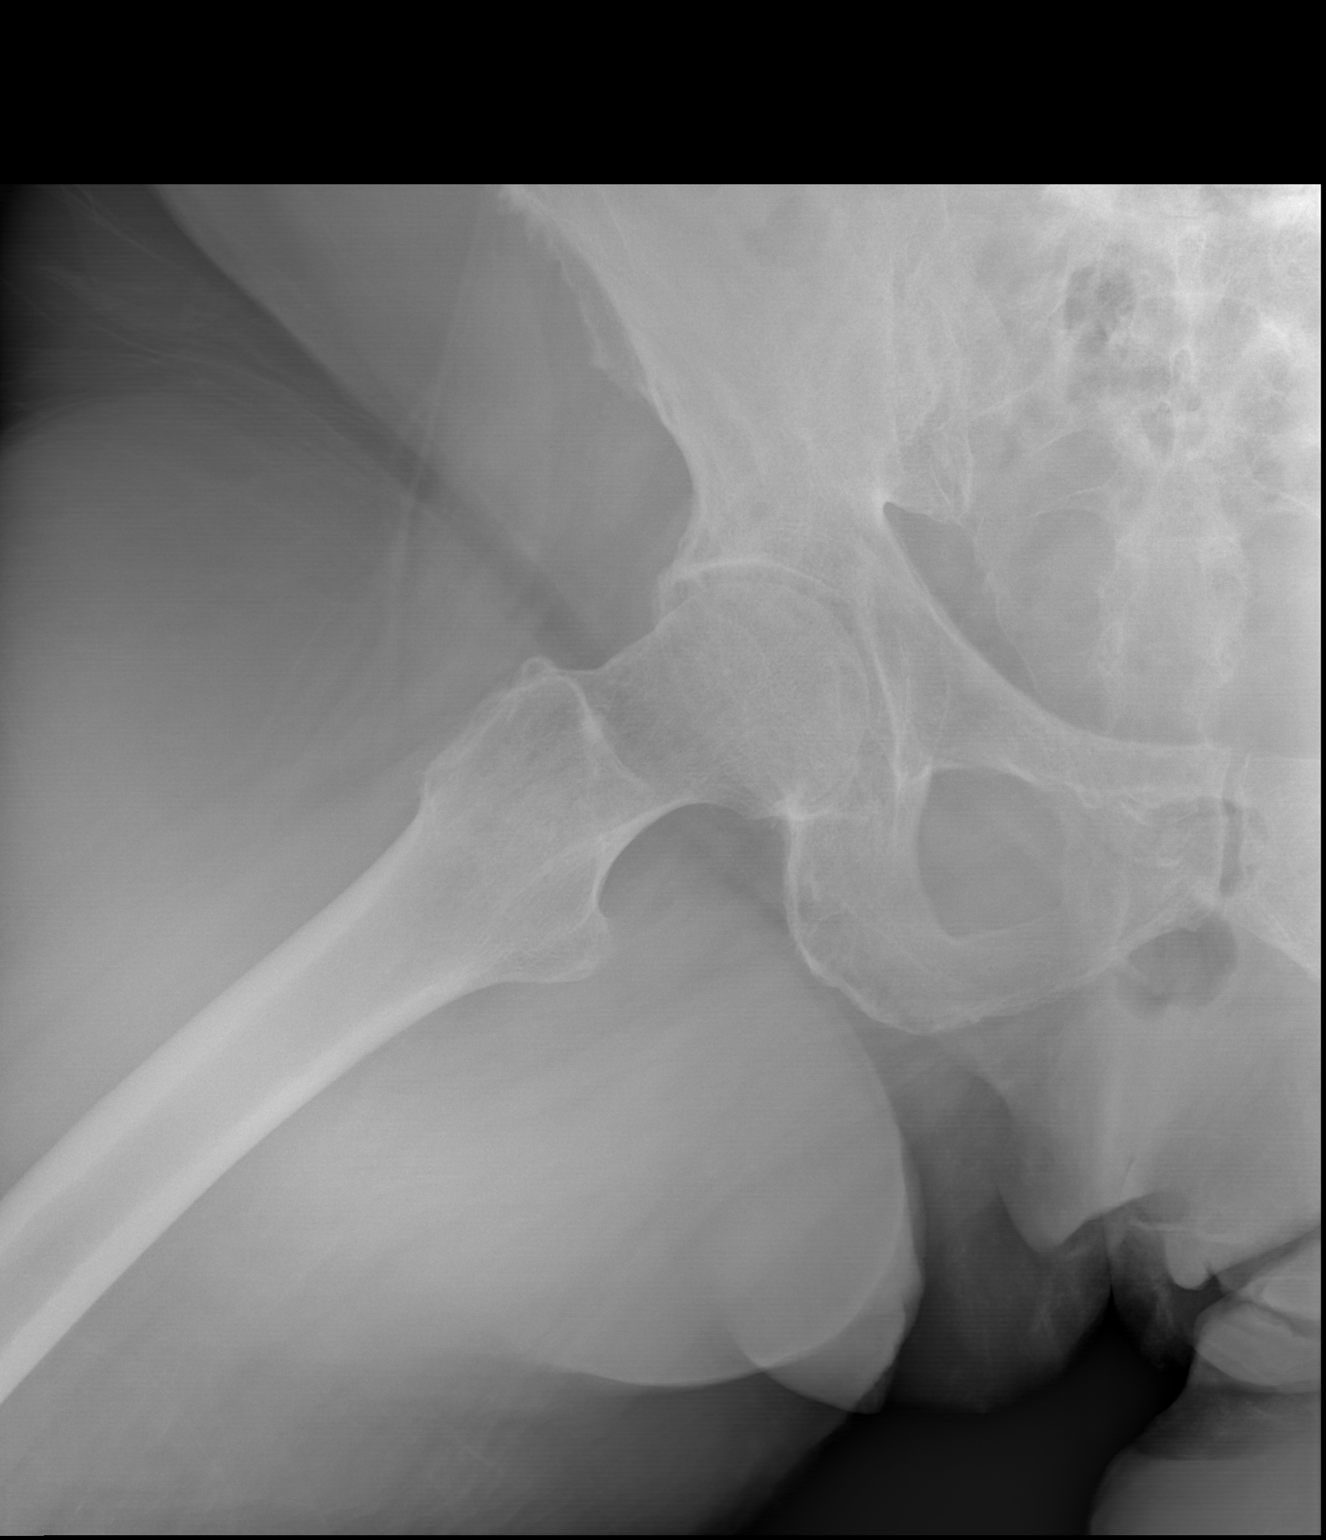

[3 of 3 positions shown; findings below may reference images not displayed]

FINDINGS: Moderate lower lumbar degenerative changes. Mild osteoarthrosis of
bilateral sacroiliac joints. Moderate osteoarthrosis of the hip
joints with acetabular fibrocystic degeneration. No acute fracture
or dislocation is identified.
IMPRESSION: No acute fracture or dislocation identified. If symptoms persist or
if clinically indicated MRI is more sensitive for acute fracture in
the elderly.

By: Nelson Lopes Suamino M.D.

## 2018-06-07 IMAGING — CR DG KNEE COMPLETE 4+V*R*
4 series · 4 of 4 positions shown · non-contrast
Comparison: None.

CLINICAL DATA: Knee gave out this morning, initial encounter.

EXAM:
RIGHT KNEE - COMPLETE 4+ VIEW

[x knee ap right (1 of 4)]
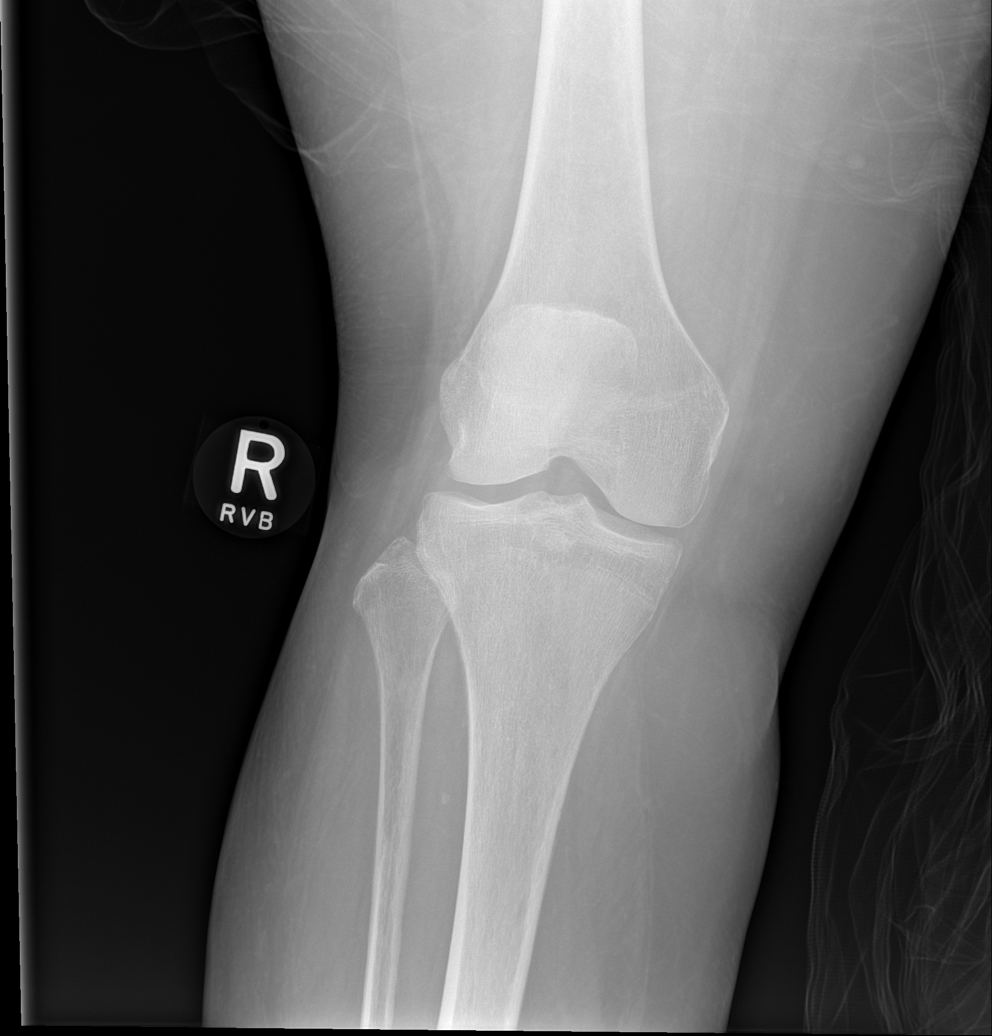

[x knee ap right (2 of 4)]
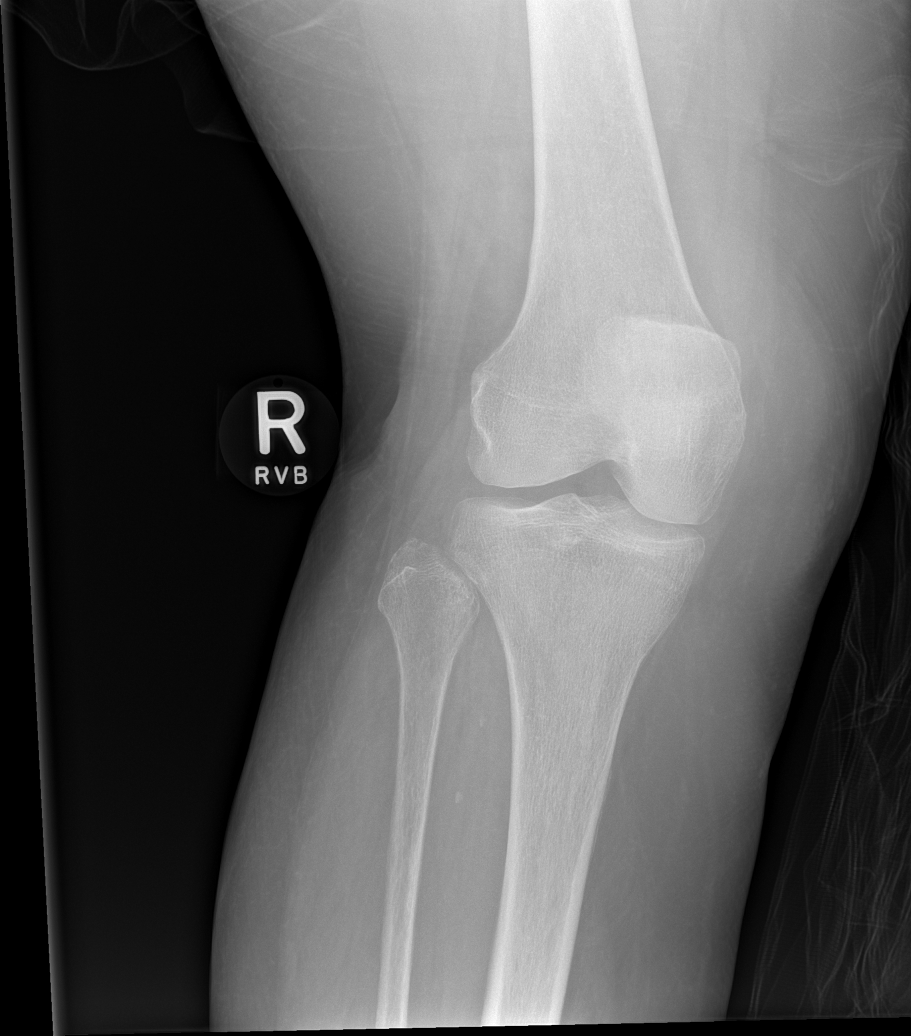

[x knee ap right (3 of 4)]
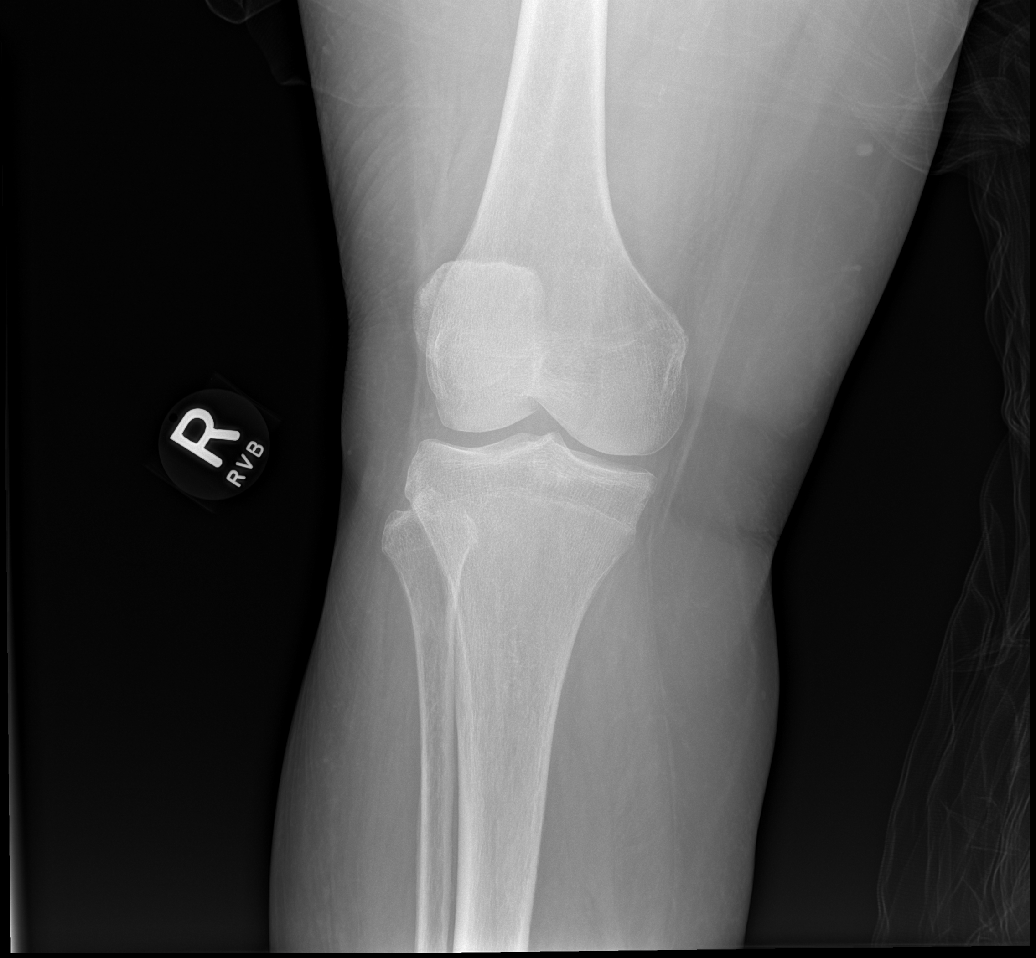

[x knee ap right (4 of 4)]
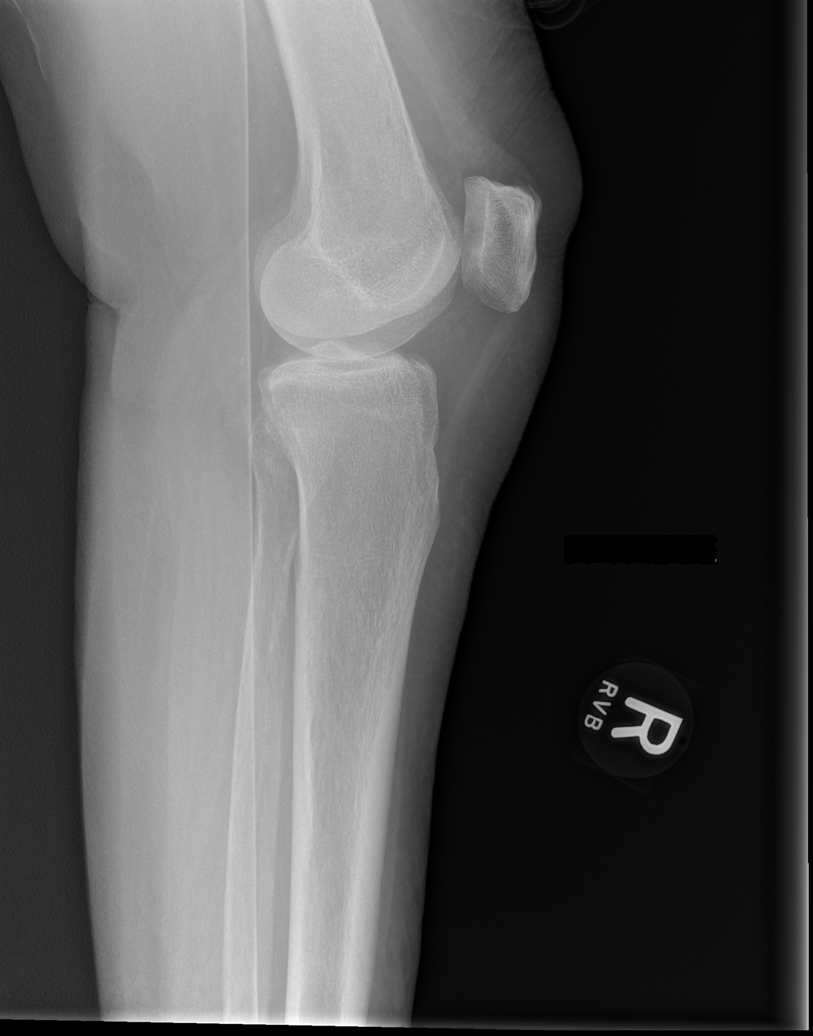

[4 of 4 positions shown; findings below may reference images not displayed]

FINDINGS: No acute osseous or joint abnormality.  No osteoarthritic changes.
IMPRESSION: Negative.

## 2018-07-18 ENCOUNTER — Other Ambulatory Visit: Payer: Self-pay | Admitting: Dermatology

## 2019-09-20 ENCOUNTER — Encounter: Payer: Self-pay | Admitting: *Deleted

## 2020-09-13 DEATH — deceased
